# Patient Record
Sex: Female | Born: 1985
Health system: Southern US, Community
[De-identification: ages and names within clinical notes are randomized; demographics above are authoritative.]

## PROBLEM LIST (undated history)

## (undated) DIAGNOSIS — E119 Type 2 diabetes mellitus without complications: Secondary | ICD-10-CM

## (undated) DIAGNOSIS — R7611 Nonspecific reaction to tuberculin skin test without active tuberculosis: Secondary | ICD-10-CM

## (undated) DIAGNOSIS — O10919 Unspecified pre-existing hypertension complicating pregnancy, unspecified trimester: Secondary | ICD-10-CM

## (undated) DIAGNOSIS — N9489 Other specified conditions associated with female genital organs and menstrual cycle: Secondary | ICD-10-CM

## (undated) DIAGNOSIS — Z973 Presence of spectacles and contact lenses: Secondary | ICD-10-CM

## (undated) DIAGNOSIS — Z8759 Personal history of other complications of pregnancy, childbirth and the puerperium: Secondary | ICD-10-CM

## (undated) HISTORY — DX: Unspecified pre-existing hypertension complicating pregnancy, unspecified trimester: O10.919

## (undated) HISTORY — DX: Nonspecific reaction to tuberculin skin test without active tuberculosis: R76.11

## (undated) HISTORY — PX: BREAST SURGERY: SHX581

---

## 2004-06-27 HISTORY — PX: TONSILLECTOMY: SUR1361

## 2008-06-27 DIAGNOSIS — Z9289 Personal history of other medical treatment: Secondary | ICD-10-CM

## 2008-06-27 HISTORY — DX: Personal history of other medical treatment: Z92.89

## 2011-05-16 DIAGNOSIS — L709 Acne, unspecified: Secondary | ICD-10-CM | POA: Insufficient documentation

## 2011-05-16 DIAGNOSIS — R5383 Other fatigue: Secondary | ICD-10-CM | POA: Insufficient documentation

## 2011-06-28 HISTORY — PX: BREAST REDUCTION SURGERY: SHX8

## 2011-08-17 DIAGNOSIS — G8929 Other chronic pain: Secondary | ICD-10-CM | POA: Insufficient documentation

## 2011-08-22 DIAGNOSIS — E559 Vitamin D deficiency, unspecified: Secondary | ICD-10-CM | POA: Insufficient documentation

## 2011-08-25 DIAGNOSIS — R030 Elevated blood-pressure reading, without diagnosis of hypertension: Secondary | ICD-10-CM | POA: Insufficient documentation

## 2011-09-13 DIAGNOSIS — N62 Hypertrophy of breast: Secondary | ICD-10-CM | POA: Insufficient documentation

## 2011-12-14 DIAGNOSIS — N62 Hypertrophy of breast: Secondary | ICD-10-CM | POA: Insufficient documentation

## 2011-12-14 HISTORY — DX: Hypertrophy of breast: N62

## 2012-01-06 DIAGNOSIS — Z9889 Other specified postprocedural states: Secondary | ICD-10-CM | POA: Insufficient documentation

## 2012-01-06 HISTORY — DX: Other specified postprocedural states: Z98.890

## 2016-11-02 DIAGNOSIS — E1169 Type 2 diabetes mellitus with other specified complication: Secondary | ICD-10-CM | POA: Insufficient documentation

## 2016-11-02 DIAGNOSIS — I10 Essential (primary) hypertension: Secondary | ICD-10-CM

## 2016-11-02 DIAGNOSIS — I152 Hypertension secondary to endocrine disorders: Secondary | ICD-10-CM | POA: Insufficient documentation

## 2016-11-02 DIAGNOSIS — E669 Obesity, unspecified: Secondary | ICD-10-CM | POA: Insufficient documentation

## 2016-11-02 HISTORY — DX: Essential (primary) hypertension: I10

## 2018-07-28 HISTORY — PX: COLONOSCOPY WITH ESOPHAGOGASTRODUODENOSCOPY (EGD): SHX5779

## 2019-02-28 DIAGNOSIS — O10919 Unspecified pre-existing hypertension complicating pregnancy, unspecified trimester: Secondary | ICD-10-CM | POA: Insufficient documentation

## 2019-02-28 DIAGNOSIS — N979 Female infertility, unspecified: Secondary | ICD-10-CM | POA: Insufficient documentation

## 2019-02-28 DIAGNOSIS — Z6837 Body mass index (BMI) 37.0-37.9, adult: Secondary | ICD-10-CM | POA: Insufficient documentation

## 2019-02-28 DIAGNOSIS — Z3400 Encounter for supervision of normal first pregnancy, unspecified trimester: Secondary | ICD-10-CM | POA: Insufficient documentation

## 2019-02-28 HISTORY — DX: Female infertility, unspecified: N97.9

## 2019-03-01 DIAGNOSIS — O24313 Unspecified pre-existing diabetes mellitus in pregnancy, third trimester: Secondary | ICD-10-CM | POA: Insufficient documentation

## 2019-03-01 DIAGNOSIS — R7989 Other specified abnormal findings of blood chemistry: Secondary | ICD-10-CM

## 2019-03-01 HISTORY — DX: Other specified abnormal findings of blood chemistry: R79.89

## 2019-09-08 DIAGNOSIS — B951 Streptococcus, group B, as the cause of diseases classified elsewhere: Secondary | ICD-10-CM | POA: Insufficient documentation

## 2019-09-08 HISTORY — DX: Streptococcus, group b, as the cause of diseases classified elsewhere: B95.1

## 2020-06-12 ENCOUNTER — Other Ambulatory Visit (HOSPITAL_COMMUNITY): Payer: Self-pay | Admitting: Nurse Practitioner

## 2020-06-12 DIAGNOSIS — E669 Obesity, unspecified: Secondary | ICD-10-CM | POA: Diagnosis not present

## 2020-06-12 DIAGNOSIS — E119 Type 2 diabetes mellitus without complications: Secondary | ICD-10-CM | POA: Diagnosis not present

## 2020-06-12 DIAGNOSIS — Z6835 Body mass index (BMI) 35.0-35.9, adult: Secondary | ICD-10-CM | POA: Diagnosis not present

## 2020-06-15 DIAGNOSIS — Z6836 Body mass index (BMI) 36.0-36.9, adult: Secondary | ICD-10-CM | POA: Diagnosis not present

## 2020-06-15 DIAGNOSIS — Z01419 Encounter for gynecological examination (general) (routine) without abnormal findings: Secondary | ICD-10-CM | POA: Diagnosis not present

## 2020-06-16 MED FILL — metFORMIN HCL ER 500 MG TB2: 500 | 30 days supply | Qty: 30 | Fill #0

## 2020-07-03 ENCOUNTER — Ambulatory Visit: Payer: Self-pay | Admitting: Family Medicine

## 2020-07-11 MED FILL — metFORMIN HCL ER 500 MG TB2: 500 | 30 days supply | Qty: 30 | Fill #1

## 2020-07-14 ENCOUNTER — Ambulatory Visit: Payer: Self-pay | Admitting: Family Medicine

## 2020-07-20 ENCOUNTER — Other Ambulatory Visit: Payer: Self-pay | Admitting: Family Medicine

## 2020-07-22 ENCOUNTER — Other Ambulatory Visit: Payer: Self-pay

## 2020-07-22 ENCOUNTER — Other Ambulatory Visit: Payer: Self-pay | Admitting: Family Medicine

## 2020-07-22 ENCOUNTER — Ambulatory Visit (INDEPENDENT_AMBULATORY_CARE_PROVIDER_SITE_OTHER): Payer: 59 | Admitting: Family Medicine

## 2020-07-22 ENCOUNTER — Encounter: Payer: Self-pay | Admitting: Family Medicine

## 2020-07-22 VITALS — BP 127/88 | HR 88 | Temp 97.9°F | Ht 68.5 in | Wt 225.0 lb

## 2020-07-22 DIAGNOSIS — Z0000 Encounter for general adult medical examination without abnormal findings: Secondary | ICD-10-CM

## 2020-07-22 DIAGNOSIS — Z23 Encounter for immunization: Secondary | ICD-10-CM | POA: Diagnosis not present

## 2020-07-22 DIAGNOSIS — E669 Obesity, unspecified: Secondary | ICD-10-CM

## 2020-07-22 DIAGNOSIS — E1169 Type 2 diabetes mellitus with other specified complication: Secondary | ICD-10-CM

## 2020-07-22 LAB — POCT GLYCOSYLATED HEMOGLOBIN (HGB A1C)
HbA1c POC (<> result, manual entry): 6.7 % (ref 4.0–5.6)
HbA1c, POC (controlled diabetic range): 6.7 % (ref 0.0–7.0)
HbA1c, POC (prediabetic range): 6.7 % — AB (ref 5.7–6.4)
Hemoglobin A1C: 6.7 % — AB (ref 4.0–5.6)

## 2020-07-22 MED ORDER — METFORMIN HCL ER 500 MG PO TB24: 500.0000 mg | ORAL_TABLET | Freq: Two times a day (BID) | ORAL | 1 refills | Status: DC

## 2020-07-22 NOTE — Patient Instructions (Addendum)
Diabetes Mellitus and Nutrition, Adult When you have diabetes, or diabetes mellitus, it is very important to have healthy eating habits because your blood sugar (glucose) levels are greatly affected by what you eat and drink. Eating healthy foods in the right amounts, at about the same times every day, can help you:  Control your blood glucose.  Lower your risk of heart disease.  Improve your blood pressure.  Reach or maintain a healthy weight. What can affect my meal plan? Every person with diabetes is different, and each person has different needs for a meal plan. Your health care provider may recommend that you work with a dietitian to make a meal plan that is best for you. Your meal plan may vary depending on factors such as:  The calories you need.  The medicines you take.  Your weight.  Your blood glucose, blood pressure, and cholesterol levels.  Your activity level.  Other health conditions you have, such as heart or kidney disease. How do carbohydrates affect me? Carbohydrates, also called carbs, affect your blood glucose level more than any other type of food. Eating carbs naturally raises the amount of glucose in your blood. Carb counting is a method for keeping track of how many carbs you eat. Counting carbs is important to keep your blood glucose at a healthy level, especially if you use insulin or take certain oral diabetes medicines. It is important to know how many carbs you can safely have in each meal. This is different for every person. Your dietitian can help you calculate how many carbs you should have at each meal and for each snack. How does alcohol affect me? Alcohol can cause a sudden decrease in blood glucose (hypoglycemia), especially if you use insulin or take certain oral diabetes medicines. Hypoglycemia can be a life-threatening condition. Symptoms of hypoglycemia, such as sleepiness, dizziness, and confusion, are similar to symptoms of having too much  alcohol.  Do not drink alcohol if: ? Your health care provider tells you not to drink. ? You are pregnant, may be pregnant, or are planning to become pregnant.  If you drink alcohol: ? Do not drink on an empty stomach. ? Limit how much you use to:  0-1 drink a day for women.  0-2 drinks a day for men. ? Be aware of how much alcohol is in your drink. In the [place] one drink equals one 12 oz bottle of beer (355 mL), one 5 oz glass of wine (148 mL), or one 1 oz glass of hard liquor (44 mL). ? Keep yourself hydrated with water, diet soda, or unsweetened iced tea.  Keep in mind that regular soda, juice, and other mixers may contain a lot of sugar and must be counted as carbs. What are tips for following this plan? Reading food labels  Start by checking the serving size on the "Nutrition Facts" label of packaged foods and drinks. The amount of calories, carbs, fats, and other nutrients listed on the label is based on one serving of the item. Many items contain more than one serving per package.  Check the total grams (g) of carbs in one serving. You can calculate the number of servings of carbs in one serving by dividing the total carbs by 15. For example, if a food has 30 g of total carbs per serving, it would be equal to 2 servings of carbs.  Check the number of grams (g) of saturated fats and trans fats in one serving. Choose foods that have   a low amount or none of these fats.  Check the number of milligrams (mg) of salt (sodium) in one serving. Most people should limit total sodium intake to less than 2,300 mg per day.  Always check the nutrition information of foods labeled as "low-fat" or "nonfat." These foods may be higher in added sugar or refined carbs and should be avoided.  Talk to your dietitian to identify your daily goals for nutrients listed on the label. Shopping  Avoid buying canned, pre-made, or processed foods. These foods tend to be high in fat, sodium, and added  sugar.  Shop around the outside edge of the grocery store. This is where you will most often find fresh fruits and vegetables, bulk grains, fresh meats, and fresh dairy. Cooking  Use low-heat cooking methods, such as baking, instead of high-heat cooking methods like deep frying.  Cook using healthy oils, such as olive, canola, or sunflower oil.  Avoid cooking with butter, cream, or high-fat meats. Meal planning  Eat meals and snacks regularly, preferably at the same times every day. Avoid going long periods of time without eating.  Eat foods that are high in fiber, such as fresh fruits, vegetables, beans, and whole grains. Talk with your dietitian about how many servings of carbs you can eat at each meal.  Eat 4-6 oz (112-168 g) of lean protein each day, such as lean meat, chicken, fish, eggs, or tofu. One ounce (oz) of lean protein is equal to: ? 1 oz (28 g) of meat, chicken, or fish. ? 1 egg. ?  cup (62 g) of tofu.  Eat some foods each day that contain healthy fats, such as avocado, nuts, seeds, and fish.   What foods should I eat? Fruits Berries. Apples. Oranges. Peaches. Apricots. Plums. Grapes. Mango. Papaya. Pomegranate. Kiwi. Cherries. Vegetables Lettuce. Spinach. Leafy greens, including kale, chard, collard greens, and mustard greens. Beets. Cauliflower. Cabbage. Broccoli. Carrots. Green beans. Tomatoes. Peppers. Onions. Cucumbers. Brussels sprouts. Grains Whole grains, such as whole-wheat or whole-grain bread, crackers, tortillas, cereal, and pasta. Unsweetened oatmeal. Quinoa. Brown or wild rice. Meats and other proteins Seafood. Poultry without skin. Lean cuts of poultry and beef. Tofu. Nuts. Seeds. Dairy Low-fat or fat-free dairy products such as milk, yogurt, and cheese. The items listed above may not be a complete list of foods and beverages you can eat. Contact a dietitian for more information. What foods should I avoid? Fruits Fruits canned with  syrup. Vegetables Canned vegetables. Frozen vegetables with butter or cream sauce. Grains Refined white flour and flour products such as bread, pasta, snack foods, and cereals. Avoid all processed foods. Meats and other proteins Fatty cuts of meat. Poultry with skin. Breaded or fried meats. Processed meat. Avoid saturated fats. Dairy Full-fat yogurt, cheese, or milk. Beverages Sweetened drinks, such as soda or iced tea. The items listed above may not be a complete list of foods and beverages you should avoid. Contact a dietitian for more information. Questions to ask a health care provider  Do I need to meet with a diabetes educator?  Do I need to meet with a dietitian?  What number can I call if I have questions?  When are the best times to check my blood glucose? Where to find more information:  American Diabetes Association: diabetes.org  Academy of Nutrition and Dietetics: www.eatright.org  National Institute of Diabetes and Digestive and Kidney Diseases: www.niddk.nih.gov  Association of Diabetes Care and Education Specialists: www.diabeteseducator.org Summary  It is important to have healthy eating   habits because your blood sugar (glucose) levels are greatly affected by what you eat and drink.  A healthy meal plan will help you control your blood glucose and maintain a healthy lifestyle.  Your health care provider may recommend that you work with a dietitian to make a meal plan that is best for you.  Keep in mind that carbohydrates (carbs) and alcohol have immediate effects on your blood glucose levels. It is important to count carbs and to use alcohol carefully. This information is not intended to replace advice given to you by your health care provider. Make sure you discuss any questions you have with your health care provider. Document Revised: 05/21/2019 Document Reviewed: 05/21/2019 Elsevier Patient Education  2021 Elsevier Inc.    Please help [place] help you:   We are honored you have chosen [facility] for your Primary Care home. Below you will find basic instructions that you may need to access in the future. Please help [place] help you by reading the instructions, which cover many of the frequent questions we experience.   Prescription refills and request:  -In order to allow more efficient response time, please call your pharmacy for all refills. They will forward the request electronically to [place]. This allows for the quickest possible response. Request left on a nurse line can take longer to refill, since these are checked as time allows between office patients and other phone calls.  - refill request can take up to 3-5 working days to complete.  - If request is sent electronically and request is appropiate, it is usually completed in 1-2 business days.  - all patients will need to be seen routinely for all chronic medical conditions requiring prescription medications (see follow-up below). If you are overdue for follow up on your condition, you will be asked to make an appointment and we will call in enough medication to cover you until your appointment (up to 30 days).  - all controlled substances will require a face to face visit to request/refill.  - if you desire your prescriptions to go through a new pharmacy, and have an active script at original pharmacy, you will need to call your pharmacy and have scripts transferred to new pharmacy. This is completed between the pharmacy locations and not by your provider.    Results: Our office handles many outgoing and incoming calls daily. If we have not contacted you within 1 week about your results, please check your mychart to see if there is a message first and if not, then contact our office.  In helping with this matter, you help decrease call volume, and therefore allow [place] to be able to respond to patients needs more efficiently.  We will always attempt to call you with results,  normal or abnormal.  However, if we are unable to reach you we will send a message in your my chart with results.   Acute office visits (sick visit):  An acute visit is intended for a new problem and are scheduled in shorter time slots to allow schedule openings for patients with new problems. This is the appropriate visit to discuss a new problem. Problems will not be addressed by phone call or Echart message. Appointment is needed if requesting treatment. In order to provide you with excellent quality medical care with proper time for you to explain your problem, have an exam and receive treatment with instructions, these appointments should be limited to one new problem per visit. If you experience a new  problem, in which you desire to be addressed, please make an acute office visit, we save openings on the schedule to accommodate you. Please do not save your new problem for any other type of visit, let us take care of it properly and quickly for you.   Follow up visits:  Depending on your condition(s) your provider will need to see you routinely in order to provide you with quality care and prescribe medication(s). Most chronic conditions (Example: hypertension, Diabetes, depression/anxiety... etc), require visits a couple times a year. Your provider will instruct you on proper follow up for your personal medical conditions and history. Please make certain to make follow up appointments for your condition as instructed. Failing to do so could result in lapse in your medication treatment/refills. If you request a refill, and are overdue to be seen on a condition, we will always provide you with a 30 day script (once) to allow you time to schedule.     Yearly physical (well visit):  - Adults are recommended to be seen yearly for physicals. Check with your insurance and date of your last physical, most insurances require one calendar year between physicals. Physicals include all preventive health topics, screenings, medical  exam and labs that are appropriate for gender/age and history. You may have fasting labs needed at this visit. This is a well visit (not a sick visit), new problems should not be covered during this visit (see acute visit).  - Pediatric patients are seen more frequently when they are younger. Your provider will advise you on well child visit timing that is appropriate for your their age. - This is not a medicare wellness visit. Medicare wellness exams do not have an exam portion to the visit. Some medicare companies allow for a physical, some do not allow a yearly physical. If your medicare allows a yearly physical you can schedule the medicare wellness with our nurse Selena Batten and have your physical with your provider after, on the same day. Please check with insurance for your full benefits.   Late Policy/No Shows:  - all new patients should arrive 15-30 minutes earlier than appointment to allow [place] time  to  obtain all personal demographics,  insurance information and for you to complete office paperwork. - All established patients should arrive 10-15 minutes earlier than appointment time to update all information and be checked in .  - In our best efforts to run on time, if you are late for your appointment you will be asked to either reschedule or if able, we will work you back into the schedule. There will be a wait time to work you back in the schedule,  depending on availability.  - If you are unable to make it to your appointment as scheduled, please call 24 hours ahead of time to allow [place] to fill the time slot with someone else who needs to be seen. If you do not cancel your appointment ahead of time, you may be charged a no show fee.

## 2020-07-22 NOTE — Progress Notes (Signed)
Patient ID: Melinda Castillo, female  DOB: 1986-04-20, 35 y.o.   MRN: [id number] Patient Care Team    Relationship Specialty Notifications Start End  Natalia Leatherwood, DO PCP - General Family Medicine  07/22/20   Raynald Blend, OD  Optometry  07/22/20   Mitchel Honour, DO Consulting Physician Obstetrics and Gynecology  07/22/20   Alric Quan, [facility]    07/22/20 07/22/20  Marnette Burgess, MD  Gastroenterology  07/22/20     Chief Complaint  Patient presents with  . Establish Care    [facility];     Subjective: Melinda Castillo is a 35 y.o.  female present for new patient establishment. All past medical history, surgical history, allergies, family history, immunizations, medications and social history were updated in the electronic medical record today. All recent labs, ED visits and hospitalizations within the last year were reviewed.  Diabetes type 2: Pt reports compliance with metformin 500 ER. Insulin with pregnancy. Denies numbness, tingling of extremities, hypo/hyperglycemic events or non-healing wounds. Pt reports BG ranges are not routinely checked.  PNA series: PNA23 completed today Flu shot: UTD 2021/10(recommneded yearly) BMP: UTD 03/2020 Foot exam: 07/22/2020 Eye exam: 05/2019, Dr. Neale Burly A1c: 7.5 in October 2021> 6.7   Depression screen [facility] 2/9 07/22/2020  Decreased Interest 0  Down, Depressed, Hopeless 0  PHQ - 2 Score 0   No flowsheet data found.      Fall Risk  07/22/2020  Falls in the past year? 0  Number falls in past yr: 0  Injury with Fall? 0  Follow up Falls evaluation completed     Immunization History  Administered Date(s) Administered  . Influenza Split 03/28/2013  . Influenza, Quadrivalent, Recombinant, Inj, Pf 03/28/2019  . Influenza, Seasonal, Injecte, Preservative Fre 03/27/2018, 03/28/2019  . Influenza-Unspecified 03/27/2020  . PFIZER(Purple Top)SARS-COV-2 Vaccination 06/29/2019, 07/20/2019, 03/26/2020  . Rho  (D) Immune Globulin 07/15/2019, 09/26/2019  . Tdap 08/02/2019    No exam data present  Past Medical History:  Diagnosis Date  . Elevated liver function tests 03/01/2019   Formatting of this note might be different from the original. 02/28/2019: AST/ALT 59/60; repeat next visit. AST normal, ALT = 43 03/26/19  . Essential hypertension 11/02/2016  . Group beta Strep positive 09/08/2019  . History of bilateral breast reduction surgery 01/06/2012  . Infertility, 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 IVF pregnancy through Dr Elesa Hacker Day 5 embryo transfer. PGS-A testing completed  . Macromastia 12/14/2011   No Known Allergies Past Surgical History:  Procedure Laterality Date  . BREAST REDUCTION SURGERY  2013  . CESAREAN SECTION  2021  . COLONOSCOPY WITH ESOPHAGOGASTRODUODENOSCOPY (EGD)  07/2018   r/o celiac- completed for IBS sx. Normal.   . TONSILLECTOMY  2006   Family History  Problem Relation Age of Onset  . Hypertension Mother   . Hypertension Father   . Ovarian cancer Maternal Grandmother   . Early death Maternal Grandmother   . Heart disease Maternal Grandfather   . Brain cancer Paternal Grandfather   . Colon cancer Neg Hx   . Colon polyps Neg Hx    Social History   Social History Narrative  . Not on file    Allergies as of 07/22/2020   No Known Allergies     Medication List       Accurate as of July 22, 2020  4:07 PM. If you have any questions, ask your nurse or doctor.  STOP taking these medications   Multi-Vitamin tablet Stopped by: Felix Pacini, DO   Norlyda 0.35 MG tablet Generic drug: norethindrone Stopped by: Felix Pacini, DO   QSYMIA PO Stopped by: Felix Pacini, DO     TAKE these medications   metFORMIN 500 MG 24 hr tablet Commonly known as: GLUCOPHAGE-XR Take 1 tablet (500 mg total) by mouth in the morning and at bedtime. What changed: See the new instructions. Changed by: Felix Pacini, DO    prenatal multivitamin Tabs tablet Take 1 tablet by mouth daily at 12 noon.   ZyrTEC Allergy 10 MG Caps Generic drug: Cetirizine HCl Zyrtec       All past medical history, surgical history, allergies, family history, immunizations andmedications were updated in the EMR today and reviewed under the history and medication portions of their EMR.    Recent Results (from the past 2160 hour(s))  POCT HgB A1C     Status: Abnormal   Collection Time: 07/22/20  3:22 PM  Result Value Ref Range   Hemoglobin A1C 6.7 (A) 4.0 - 5.6 %   HbA1c POC (<> result, manual entry) 6.7 4.0 - 5.6 %   HbA1c, POC (prediabetic range) 6.7 (A) 5.7 - 6.4 %   HbA1c, POC (controlled diabetic range) 6.7 0.0 - 7.0 %    Patient was never admitted.   ROS: 14 pt review of systems performed and negative (unless mentioned in an HPI)  Objective: BP 127/88   Pulse 88   Temp 97.9 F (36.6 C) (Oral)   Ht 5' 8.5" (1.74 m)   Wt 225 lb (102.1 kg)   SpO2 99%   BMI 33.71 kg/m  Gen: Afebrile. No acute distress. Nontoxic in appearance, well-developed, well-nourished, pleasant female. HENT: AT. [place].  Eyes:Pupils Equal Round Reactive to light, Extraocular movements intact,  Conjunctiva without redness, discharge or icterus. Neck/lymp/endocrine: Supple,no lymphadenopathy, no thyromegaly CV: RRR no murmur, no edema, +2/4 P posterior tibialis pulses.  Chest: CTAB, no wheeze, rhonchi or crackles. normal Respiratory effort. good Air movement. Skin: Warm and well-perfused. Skin intact. Neuro/Msk:  Normal gait. PERLA. EOMi. Alert. Oriented x3.   Psych: Normal affect, dress and demeanor. Normal speech. Normal thought content and judgment. Diabetic Foot Exam - Simple   Simple Foot Form Diabetic Foot exam was performed with the following findings: Yes 07/22/2020  3:51 PM  Visual Inspection No deformities, no ulcerations, no other skin breakdown bilaterally: Yes Sensation Testing Intact to touch and monofilament testing  bilaterally: Yes Pulse Check Posterior Tibialis and Dorsalis pulse intact bilaterally: Yes Comments     Assessment/plan: Melinda Castillo is a 35 y.o. female present for est care/cmc Encounter for medical examination to establish care Diabetes mellitus type 2 in obese (HCC) Stable.  - increase metformin to 500 mg BID> pt is planning on 2nd pregnancy soon > if becomes pregnant OB will take over DM management during pregnancy.  - diabetic diet - continue routine exercise.  - POCT HgB A1C - Urine Microalbumin w/creat. ratio - Pneumococcal polysaccharide vaccine 23-valent greater than or equal to 2yo subcutaneous/IM PNA series: PNA23 completed today Flu shot: UTD 2021/10(recommneded yearly) BMP: UTD 03/2020 Microalb: collected today Foot exam: 07/22/2020 Eye exam: 05/2019, Dr. Neale Burly A1c: 7.5 in October 2021> 6.7 F/u 4 mos> once at goal ~6.0 will follow every 6 mos.   Orders Placed This Encounter  Procedures  . Pneumococcal polysaccharide vaccine 23-valent greater than or equal to 2yo subcutaneous/IM  . Urine Microalbumin w/creat. ratio  . POCT HgB A1C  Meds ordered this encounter  Medications  . metFORMIN (GLUCOPHAGE-XR) 500 MG 24 hr tablet    Sig: Take 1 tablet (500 mg total) by mouth in the morning and at bedtime.    Dispense:  180 tablet    Refill:  1   Referral Orders  No referral(s) requested today     Note is dictated utilizing voice recognition software. Although note has been proof read prior to signing, occasional typographical errors still can be missed. If any questions arise, please do not hesitate to call for verification.  Electronically signed by: Felix Pacini, DO [facility] [place]

## 2020-07-23 ENCOUNTER — Telehealth: Payer: Self-pay | Admitting: Family Medicine

## 2020-07-23 LAB — MICROALBUMIN / CREATININE URINE RATIO
Creatinine, Urine: 39 mg/dL (ref 20–275)
Microalb Creat Ratio: 5 ug/mg{creat}
Microalb, Ur: 0.2 mg/dL

## 2020-07-23 NOTE — Telephone Encounter (Signed)
Patient returned call for lab results. I relayed Dr. Alan Ripper following message "Urinalysis is normal." Patient voiced understanding.

## 2020-07-23 NOTE — Telephone Encounter (Signed)
noted 

## 2020-07-29 MED FILL — metFORMIN HCL ER 500 MG TB2: 500 | 90 days supply | Qty: 180 | Fill #0

## 2020-09-01 DIAGNOSIS — Z3169 Encounter for other general counseling and advice on procreation: Secondary | ICD-10-CM | POA: Diagnosis not present

## 2020-09-17 DIAGNOSIS — Z3141 Encounter for fertility testing: Secondary | ICD-10-CM | POA: Diagnosis not present

## 2020-09-18 ENCOUNTER — Other Ambulatory Visit (HOSPITAL_BASED_OUTPATIENT_CLINIC_OR_DEPARTMENT_OTHER): Payer: Self-pay

## 2020-09-21 ENCOUNTER — Other Ambulatory Visit (HOSPITAL_COMMUNITY): Payer: Self-pay | Admitting: Gynecology

## 2020-09-21 MED FILL — PROGESTERONE OIL 50 MG/ML V: 50 | 20 days supply | Qty: 20 | Fill #0

## 2020-09-21 MED FILL — METHYLPREDNISOLONE 8 MG TAB: 8 | 4 days supply | Qty: 8 | Fill #0

## 2020-09-21 MED FILL — BD 3 ML SYRINGE WITH NEEDLE: 23G X 1-1/2 | 30 days supply | Qty: 30 | Fill #0

## 2020-09-21 MED FILL — EASY TOUCH HYPODERMIC 18GX1: 18G X 1-1/2 | 30 days supply | Qty: 30 | Fill #0

## 2020-09-21 MED FILL — ESTRADIOL 2 MG TABS: 2 | 30 days supply | Qty: 60 | Fill #0

## 2020-10-19 MED FILL — Metformin HCl Tab ER 24HR 500 MG: ORAL | 90 days supply | Qty: 180 | Fill #0 | Status: AC

## 2020-10-20 ENCOUNTER — Other Ambulatory Visit (HOSPITAL_COMMUNITY): Payer: Self-pay

## 2020-10-22 ENCOUNTER — Other Ambulatory Visit (HOSPITAL_COMMUNITY): Payer: Self-pay

## 2020-10-22 MED FILL — Estradiol Tab 2 MG: ORAL | 30 days supply | Qty: 60 | Fill #0 | Status: AC

## 2020-10-26 DIAGNOSIS — Z3141 Encounter for fertility testing: Secondary | ICD-10-CM | POA: Diagnosis not present

## 2020-11-01 MED FILL — Syringe/Needle (Disp) 3 ML 23 x 1-1/2": 30 days supply | Qty: 30 | Fill #0 | Status: AC

## 2020-11-01 MED FILL — Progesterone IM in Oil 50 MG/ML: INTRAMUSCULAR | 20 days supply | Qty: 20 | Fill #0 | Status: AC

## 2020-11-01 MED FILL — Needle (Disp) 18 x 1-1/2": 30 days supply | Qty: 30 | Fill #0 | Status: AC

## 2020-11-02 ENCOUNTER — Other Ambulatory Visit (HOSPITAL_COMMUNITY): Payer: Self-pay

## 2020-11-02 DIAGNOSIS — N979 Female infertility, unspecified: Secondary | ICD-10-CM | POA: Diagnosis not present

## 2020-11-03 ENCOUNTER — Other Ambulatory Visit (HOSPITAL_COMMUNITY): Payer: Self-pay

## 2020-11-10 DIAGNOSIS — Z3200 Encounter for pregnancy test, result unknown: Secondary | ICD-10-CM | POA: Diagnosis not present

## 2020-11-20 ENCOUNTER — Ambulatory Visit: Payer: 59 | Admitting: Family Medicine

## 2020-11-25 ENCOUNTER — Ambulatory Visit: Payer: 59 | Admitting: Family Medicine

## 2020-11-25 ENCOUNTER — Other Ambulatory Visit (HOSPITAL_COMMUNITY): Payer: Self-pay

## 2020-11-25 MED ORDER — CLOMIPHENE CITRATE 50 MG PO TABS
ORAL_TABLET | ORAL | 6 refills | Status: DC
  Filled 2020-11-25: qty 5, 5d supply, fill #0

## 2020-11-27 ENCOUNTER — Ambulatory Visit: Payer: 59 | Admitting: Family Medicine

## 2020-11-27 ENCOUNTER — Other Ambulatory Visit: Payer: Self-pay

## 2020-11-27 ENCOUNTER — Other Ambulatory Visit (HOSPITAL_COMMUNITY): Payer: Self-pay

## 2020-11-27 ENCOUNTER — Encounter: Payer: Self-pay | Admitting: Family Medicine

## 2020-11-27 VITALS — BP 120/88 | HR 87 | Temp 97.4°F | Ht 69.0 in | Wt 232.0 lb

## 2020-11-27 DIAGNOSIS — E1169 Type 2 diabetes mellitus with other specified complication: Secondary | ICD-10-CM

## 2020-11-27 DIAGNOSIS — E669 Obesity, unspecified: Secondary | ICD-10-CM | POA: Diagnosis not present

## 2020-11-27 LAB — POCT GLYCOSYLATED HEMOGLOBIN (HGB A1C): Hemoglobin A1C: 5.9 % — AB (ref 4.0–5.6)

## 2020-11-27 MED ORDER — METFORMIN HCL ER 500 MG PO TB24
ORAL_TABLET | ORAL | 1 refills | Status: DC
  Filled 2020-11-27: qty 180, fill #0
  Filled 2021-01-21: qty 180, 90d supply, fill #0
  Filled 2021-04-27: qty 180, 90d supply, fill #1

## 2020-11-27 NOTE — Progress Notes (Signed)
Patient ID: Melinda Castillo, female  DOB: [birth year], 35 y.o.   MRN: [id number] Patient Care Team    Relationship Specialty Notifications Start End  Natalia Leatherwood, DO PCP - General Family Medicine  07/22/20   Raynald Blend, OD  Optometry  07/22/20   Mitchel Honour, DO Consulting Physician Obstetrics and Gynecology  07/22/20   Marnette Burgess, MD  Gastroenterology  07/22/20     Chief Complaint  Patient presents with  . Follow-up    [facility]; pt is not fasting  . Diabetes    Subjective: Melinda Castillo is a 35 y.o.  female present for [facility]  Diabetes type 2: Pt reports compliance  with metformin 500 ER BID. She did have Insulin with pregnancy. Patient denies dizziness, hyperglycemic or hypoglycemic events. Patient denies numbness, tingling in the extremities or nonhealing wounds of feet.  PNA series: PNA23 completed Flu shot: UTD 2021/10(recommneded yearly) Foot exam: 07/22/2020 Eye exam: 05/2019, Dr. Neale Burly- has appt June 20th scheduled.  A1c: 7.5 in October 2021> 6.7 (1/22)   Depression screen [facility] 2/9 07/22/2020  Decreased Interest 0  Down, Depressed, Hopeless 0  PHQ - 2 Score 0   No flowsheet data found.     Fall Risk  07/22/2020  Falls in the past year? 0  Number falls in past yr: 0  Injury with Fall? 0  Follow up Falls evaluation completed   Immunization History  Administered Date(s) Administered  . Influenza Split 03/28/2013  . Influenza, Quadrivalent, Recombinant, Inj, Pf 03/28/2019  . Influenza, Seasonal, Injecte, Preservative Fre 03/27/2018, 03/28/2019  . Influenza-Unspecified 03/27/2020  . PFIZER(Purple Top)SARS-COV-2 Vaccination 06/29/2019, 07/20/2019, 03/26/2020  . Pneumococcal Polysaccharide-23 07/22/2020  . Rho (D) Immune Globulin 07/15/2019, 09/26/2019  . Tdap 08/02/2019    No exam data present  Past Medical History:  Diagnosis Date  . Elevated liver function tests 03/01/2019   Formatting of this note might be different from the original.  02/28/2019: AST/ALT 59/60; repeat next visit. AST normal, ALT = 43 03/26/19  . Essential hypertension 11/02/2016  . Group beta Strep positive 09/08/2019  . History of bilateral breast reduction surgery 01/06/2012  . Infertility, 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 IVF pregnancy through Dr Elesa Hacker Day 5 embryo transfer. PGS-A testing completed  . Macromastia 12/14/2011  . 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 BREAST REDUCTION SURGERY  2013  . CESAREAN SECTION  2021  . COLONOSCOPY WITH ESOPHAGOGASTRODUODENOSCOPY (EGD)  07/2018   r/o celiac- completed for IBS sx. Normal.   . TONSILLECTOMY  2006   Family History  Problem Relation Age of Onset  . Hypertension Mother   . Hypertension Father   . Ovarian cancer Maternal Grandmother   . Early death Maternal Grandmother   . Heart disease Maternal Grandfather   . Brain cancer Paternal Grandfather   . Stroke Paternal Grandmother   . Colon cancer Neg Hx   . Colon polyps Neg Hx    Social History   Social History Narrative   Marital status/children/pets: married, 1 child.    Education/employment: Animator, works as Scientist, clinical (histocompatibility and immunogenetics) at [employer].    Safety:      -Wears a bicycle helmet riding a bike: Yes     -smoke alarm in the home:Yes     - wears seatbelt: Yes     - Feels safe in their relationships: Yes  Allergies as of 11/27/2020   No Known Allergies     Medication List       Accurate as of November 27, 2020  3:47 PM. If you have any questions, ask your nurse or doctor.        STOP taking these medications   B-D 3CC LUER-LOK SYR 23GX1-1/2 23G X 1-1/2" 3 ML Misc Generic drug: SYRINGE-NEEDLE (DISP) 3 ML Stopped by: Felix Pacini, DO   clomiPHENE 50 MG tablet Commonly known as: CLOMID Stopped by: Felix Pacini, DO   Easy Touch Hypodermic Needle 18G X 1-1/2" Misc Generic drug: NEEDLE (DISP) 18 G Stopped by: Felix Pacini, DO    estradiol 0.1 MG/24HR patch Commonly known as: VIVELLE-DOT Stopped by: Felix Pacini, DO   estradiol 2 MG tablet Commonly known as: ESTRACE Stopped by: Felix Pacini, DO   methylPREDNISolone 8 MG tablet Commonly known as: MEDROL Stopped by: Felix Pacini, DO   Norlyda 0.35 MG tablet Generic drug: norethindrone Stopped by: Felix Pacini, DO   progesterone 50 MG/ML injection Stopped by: Felix Pacini, DO     TAKE these medications   BD Disp Needles 22G X 1-1/2" Misc Generic drug: NEEDLE (DISP) 22 G   metFORMIN 500 MG 24 hr tablet Commonly known as: GLUCOPHAGE-XR TAKE 1 TABLET BY MOUTH EVERY MORNING AND AT BEDTIME What changed: Another medication with the same name was removed. Continue taking this medication, and follow the directions you see here. Changed by: Felix Pacini, DO   prenatal multivitamin Tabs tablet Take 1 tablet by mouth daily at 12 noon.   ZyrTEC Allergy 10 MG Caps Generic drug: Cetirizine HCl Zyrtec       All past medical history, surgical history, allergies, family history, immunizations andmedications were updated in the EMR today and reviewed under the history and medication portions of their EMR.    Recent Results (from the past 2160 hour(s))  POCT HgB A1C     Status: Abnormal   Collection Time: 11/27/20  3:30 PM  Result Value Ref Range   Hemoglobin A1C 5.9 (A) 4.0 - 5.6 %   HbA1c POC (<> result, manual entry)     HbA1c, POC (prediabetic range)     HbA1c, POC (controlled diabetic range)      Patient was never admitted.   ROS: 14 pt review of systems performed and negative (unless mentioned in an HPI)  Objective: BP 120/88   Pulse 87   Temp (!) 97.4 F (36.3 C) (Oral)   Ht 5\' 9"  (1.753 m)   Wt 232 lb (105.2 kg)   SpO2 98%   BMI 34.26 kg/m  Gen: Afebrile. No acute distress.  HENT: AT. [place]. Eyes:Pupils Equal Round Reactive to light, Extraocular movements intact,  Conjunctiva without redness, discharge or icterus. CV: RRR no murmur, no  edema, +2/4 P posterior tibialis pulses Chest: CTAB, no wheeze or crackles Neuro: Normal gait. PERLA. EOMi. Alert. Oriented x3  Assessment/plan: Melinda Castillo is a 35 y.o. female present for cmc Diabetes mellitus type 2 in obese (HCC) Stable. - Continue  metformin to 500 mg BID> pt is planning on 2nd pregnancy soon > if becomes pregnant OB will take over DM management during pregnancy.  - diabetic diet - continue  routine exercise.  PNA series: PNA23 UTD Flu shot: UTD 2021/10(recommneded yearly) Microalb: UTD Foot exam: 07/22/2020 Eye exam: 05/2019, Dr. 06/2019 A1c: 7.5 in October 2021> 6.7>5.9 F/u 5.5 mos. With fasting labs.    Orders Placed This Encounter  Procedures  . POCT HgB A1C  Meds ordered this encounter  Medications  . metFORMIN (GLUCOPHAGE-XR) 500 MG 24 hr tablet    Sig: TAKE 1 TABLET BY MOUTH EVERY MORNING AND AT BEDTIME    Dispense:  180 tablet    Refill:  1   Referral Orders  No referral(s) requested today     Note is dictated utilizing voice recognition software. Although note has been proof read prior to signing, occasional typographical errors still can be missed. If any questions arise, please do not hesitate to call for verification.  Electronically signed by: Felix Pacini, DO [facility]

## 2020-11-27 NOTE — Patient Instructions (Signed)
Great to see you today.   A1c 5.9 today!!!   Diabetes Mellitus and Foot Care Foot care is an important part of your health, especially when you have diabetes. Diabetes may cause you to have problems because of poor blood flow (circulation) to your feet and legs, which can cause your skin to:  Become thinner and drier.  Break more easily.  Heal more slowly.  Peel and crack. You may also have nerve damage (neuropathy) in your legs and feet, causing decreased feeling in them. This means that you may not notice minor injuries to your feet that could lead to more serious problems. Noticing and addressing any potential problems early is the best way to prevent future foot problems. How to care for your feet Foot hygiene  Wash your feet daily with warm water and mild soap. Do not use hot water. Then, pat your feet and the areas between your toes until they are completely dry. Do not soak your feet as this can dry your skin.  Trim your toenails straight across. Do not dig under them or around the cuticle. File the edges of your nails with an emery board or nail file.  Apply a moisturizing lotion or petroleum jelly to the skin on your feet and to dry, brittle toenails. Use lotion that does not contain alcohol and is unscented. Do not apply lotion between your toes.   Shoes and socks  Wear clean socks or stockings every day. Make sure they are not too tight. Do not wear knee-high stockings since they may decrease blood flow to your legs.  Wear shoes that fit properly and have enough cushioning. Always look in your shoes before you put them on to be sure there are no objects inside.  To break in new shoes, wear them for just a few hours a day. This prevents injuries on your feet. Wounds, scrapes, corns, and calluses  Check your feet daily for blisters, cuts, bruises, sores, and redness. If you cannot see the bottom of your feet, use a mirror or ask someone for help.  Do not cut corns or  calluses or try to remove them with medicine.  If you find a minor scrape, cut, or break in the skin on your feet, keep it and the skin around it clean and dry. You may clean these areas with mild soap and water. Do not clean the area with peroxide, alcohol, or iodine.  If you have a wound, scrape, corn, or callus on your foot, look at it several times a day to make sure it is healing and not infected. Check for: ? Redness, swelling, or pain. ? Fluid or blood. ? Warmth. ? Pus or a bad smell.   General tips  Do not cross your legs. This may decrease blood flow to your feet.  Do not use heating pads or hot water bottles on your feet. They may burn your skin. If you have lost feeling in your feet or legs, you may not know this is happening until it is too late.  Protect your feet from hot and cold by wearing shoes, such as at the beach or on hot pavement.  Schedule a complete foot exam at least once a year (annually) or more often if you have foot problems. Report any cuts, sores, or bruises to your health care provider immediately. Where to find more information  American Diabetes Association: www.diabetes.org  Association of Diabetes Care & Education Specialists: www.diabeteseducator.org Contact a health care provider if:  You have a medical condition that increases your risk of infection and you have any cuts, sores, or bruises on your feet.  You have an injury that is not healing.  You have redness on your legs or feet.  You feel burning or tingling in your legs or feet.  You have pain or cramps in your legs and feet.  Your legs or feet are numb.  Your feet always feel cold.  You have pain around any toenails. Get help right away if:  You have a wound, scrape, corn, or callus on your foot and: ? You have pain, swelling, or redness that gets worse. ? You have fluid or blood coming from the wound, scrape, corn, or callus. ? Your wound, scrape, corn, or callus feels warm to  the touch. ? You have pus or a bad smell coming from the wound, scrape, corn, or callus. ? You have a fever. ? You have a red line going up your leg. Summary  Check your feet every day for blisters, cuts, bruises, sores, and redness.  Apply a moisturizing lotion or petroleum jelly to the skin on your feet and to dry, brittle toenails.  Wear shoes that fit properly and have enough cushioning.  If you have foot problems, report any cuts, sores, or bruises to your health care provider immediately.  Schedule a complete foot exam at least once a year (annually) or more often if you have foot problems. This information is not intended to replace advice given to you by your health care provider. Make sure you discuss any questions you have with your health care provider. Document Revised: 01/02/2020 Document Reviewed: 01/02/2020 Elsevier Patient Education  2021 [employer].

## 2020-12-21 DIAGNOSIS — O200 Threatened abortion: Secondary | ICD-10-CM | POA: Diagnosis not present

## 2020-12-21 DIAGNOSIS — O4691 Antepartum hemorrhage, unspecified, first trimester: Secondary | ICD-10-CM | POA: Diagnosis not present

## 2021-01-21 ENCOUNTER — Other Ambulatory Visit (HOSPITAL_COMMUNITY): Payer: Self-pay

## 2021-01-25 DIAGNOSIS — Z8759 Personal history of other complications of pregnancy, childbirth and the puerperium: Secondary | ICD-10-CM

## 2021-01-25 HISTORY — DX: Personal history of other complications of pregnancy, childbirth and the puerperium: Z87.59

## 2021-01-27 DIAGNOSIS — N912 Amenorrhea, unspecified: Secondary | ICD-10-CM | POA: Diagnosis not present

## 2021-01-29 DIAGNOSIS — N912 Amenorrhea, unspecified: Secondary | ICD-10-CM | POA: Diagnosis not present

## 2021-02-02 ENCOUNTER — Other Ambulatory Visit: Payer: Self-pay

## 2021-02-02 ENCOUNTER — Inpatient Hospital Stay (HOSPITAL_COMMUNITY)
Admission: AD | Admit: 2021-02-02 | Discharge: 2021-02-02 | Disposition: A | Discharge status: Home / Self Care | Payer: 59 | Attending: Obstetrics & Gynecology | Admitting: Obstetrics & Gynecology

## 2021-02-02 ENCOUNTER — Other Ambulatory Visit: Payer: Self-pay | Admitting: Obstetrics & Gynecology

## 2021-02-02 ENCOUNTER — Encounter (HOSPITAL_COMMUNITY): Payer: Self-pay | Admitting: Obstetrics & Gynecology

## 2021-02-02 DIAGNOSIS — N911 Secondary amenorrhea: Secondary | ICD-10-CM | POA: Diagnosis not present

## 2021-02-02 DIAGNOSIS — Z3201 Encounter for pregnancy test, result positive: Secondary | ICD-10-CM | POA: Insufficient documentation

## 2021-02-02 DIAGNOSIS — Z3A01 Less than 8 weeks gestation of pregnancy: Secondary | ICD-10-CM

## 2021-02-02 DIAGNOSIS — O34219 Maternal care for unspecified type scar from previous cesarean delivery: Secondary | ICD-10-CM

## 2021-02-02 DIAGNOSIS — O3680X0 Pregnancy with inconclusive fetal viability, not applicable or unspecified: Secondary | ICD-10-CM

## 2021-02-02 NOTE — MAU Provider Note (Signed)
History Melinda Castillo is a 35 y.o. G2P1001 at [redacted]w[redacted]d who presents from the office for ultrasound. Had ultrasound at [facility] today & was told she may have an ectopic at her c/section site. Dr. Langston Masker sent her over for u/s with MFM. Denies abdominal pain or vaginal bleeding.   Physical exam BP (!) 136/93 (BP Location: Right Arm)   Pulse 95   Temp 98.4 F (36.9 C) (Oral)   Resp 20   Ht 5\' 10"  (1.778 m)   Wt 104.9 kg   LMP 12/20/2020   SpO2 100%   BMI 33.19 kg/m   Physical Examination: General appearance - alert, well appearing, and in no distress Mental status - normal mood, behavior, speech, dress, motor activity, and thought processes Eyes - sclera anicteric Chest - normal respiratory effort   Assessment 1. Positive pregnancy test    -Per Dr. 12/22/2020 (who spoke with Dr. Judeth Cornfield) patient will need to f/u in MAU tomorrow as MFM needs to see imaging in real time. He has scheduled her in MFM for 8 am tomorrow morning with patient to arrive at 730 am.   Plan Discharge in stable condition Pt given address for MFM & instructed to arrive at 730 am tomorrow morning.

## 2021-02-02 NOTE — MAU Note (Signed)
Sent from MD office for MFM consult.  Reports has ectopic pregnancy in Cesarean section scar.  Denies VB, states it's intermittent.  LMP 12/20/2020

## 2021-02-02 NOTE — Discharge Instructions (Signed)
Return to care  If you have heavier bleeding that soaks through more than 2 pads per hour for an hour or more If you bleed so much that you feel like you might pass out or you do pass out If you have significant abdominal pain that is not improved with Tylenol   

## 2021-02-03 ENCOUNTER — Ambulatory Visit: Payer: 59 | Attending: Obstetrics and Gynecology | Admitting: Obstetrics and Gynecology

## 2021-02-03 ENCOUNTER — Encounter: Payer: Self-pay | Admitting: *Deleted

## 2021-02-03 ENCOUNTER — Ambulatory Visit (HOSPITAL_BASED_OUTPATIENT_CLINIC_OR_DEPARTMENT_OTHER): Payer: 59

## 2021-02-03 ENCOUNTER — Other Ambulatory Visit: Payer: Self-pay | Admitting: *Deleted

## 2021-02-03 ENCOUNTER — Ambulatory Visit: Payer: 59 | Admitting: *Deleted

## 2021-02-03 VITALS — BP 121/86 | HR 77

## 2021-02-03 DIAGNOSIS — Z3A01 Less than 8 weeks gestation of pregnancy: Secondary | ICD-10-CM

## 2021-02-03 DIAGNOSIS — O34219 Maternal care for unspecified type scar from previous cesarean delivery: Secondary | ICD-10-CM

## 2021-02-03 DIAGNOSIS — O3680X0 Pregnancy with inconclusive fetal viability, not applicable or unspecified: Secondary | ICD-10-CM

## 2021-02-03 DIAGNOSIS — Z3200 Encounter for pregnancy test, result unknown: Secondary | ICD-10-CM

## 2021-02-03 DIAGNOSIS — O009 Unspecified ectopic pregnancy without intrauterine pregnancy: Secondary | ICD-10-CM

## 2021-02-03 DIAGNOSIS — O008 Other ectopic pregnancy without intrauterine pregnancy: Secondary | ICD-10-CM

## 2021-02-03 DIAGNOSIS — Z3201 Encounter for pregnancy test, result positive: Secondary | ICD-10-CM | POA: Diagnosis not present

## 2021-02-03 NOTE — Progress Notes (Signed)
Maternal-Fetal Medicine   Name: Melinda Castillo DOB: 1985-09-06 MRN: [id number] Referring Provider: Mitchel Honour, MD  I had the pleasure of seeing [facility] at [facility]. She was accompanied by her husband. She is G2 P1 at 5w 6d gestation with suspected cesarean scar pregnancy.  Patient has been having intermittent vaginal bleeding.  At your office ultrasound, cesarean scar pregnancy was suspected.  Obstetric history significant for a term cesarean delivery (failure to progress in labor) of a female infant weighing 8 pounds 9 ounces at birth.  Pregnancy conceived after IVF.  Past medical history: Type 2 diabetes patient takes metformin.  Recent hemoglobin level was 5.9%.  She does not have hypertension or any other chronic medical conditions. Allergies: No known drug allergies. Prenatal course: This is a natural conception.  Ultrasound We performed a transabdominal and transvaginal ultrasound.  Findings include -A single intrauterine gestational sac is seen in the lower uterus (center of gestational sac below the midpoint of the uterus).  -Fetal pole with CRL measurement consistent with the previously established date is seen.  Fetal heart rate was 109/min.  -Gestational sac is close to the previous cesarean scar but not invading the niche. Sac appears triangular at the level of scar.  -Myometrium is thinned out and is less than 3 mm at the level of gestational sac.  -No bulging of the sac into the bladder.  Myometrial bladder interface appears normal.  -Color-flow shows increased vascularity at the implantation site.  -An echogenic material consistent with blood clot is seen close to the fundus (history of vaginal bleeding).  Impression: Cesarean scar pregnancy.  Our concerns include: Cesarean scar pregnancy -Findings strongly raise the possibility of cesarean scar pregnancy.  Ideal time to diagnose condition will be between 6- and 8-weeks'  gestation when placenta is being formed. -Differential diagnosis include intrauterine pregnancy (low possibility). -I discussed the natural course of cesarean scar pregnancy. Complications include severe hemorrhage, placenta accreta spectrum (PAS). Uterine rupture in later gestations have been reported. -Expectant management leads to hemorrhagic complications in more than 50% of cases and can lead to hysterectomy. -Most cases evolve into PAS. I discussed PAS and that it usually leads to hysterectomy at delivery. Management of cesarean scar pregnancy -Vacuum aspiration (suction curettage) under ultrasound guidance with laparoscopic/laparotomy preparations showed good results with lower complication rate -Intragestational injection of methotrexate (or less commonly, potassium chloride) followed by vacuum aspiration has also shown to be associated with fewer complications. -Systemic methotrexate alone has shown higher complication rates. However, if vacuum aspiration is the mainstay management after methotrexate, it should be associated with fewer complications. Although the diagnosis will be clearer at 7 to 8 weeks' gestation, she could experience increased vaginal bleeding in the waiting period. Spontaneous loss of scar pregnancy is a possibility (10%). Recurrence of scar pregnancy is 15% to 20%.  I spoke to Dr. April Manson [facility]) who had performed several procedures (systemic methotrexate and vacuum aspiration) with good success.  If patient decides on definitive treatment, I recommend referring to Dr. April Manson.  I discussed with Dr. Langston Masker. She called the patient who informed her that she would like to wait for 2 weeks. She is aware of the possibility of increased vaginal bleeding.  Thank you for consultation.  If you have any questions or concerns, please contact me the [facility].  Consultation including face-to-face (more than 50%) counseling 45  minutes.

## 2021-02-17 ENCOUNTER — Other Ambulatory Visit: Payer: Self-pay

## 2021-02-19 ENCOUNTER — Other Ambulatory Visit: Payer: Self-pay

## 2021-02-19 ENCOUNTER — Ambulatory Visit: Payer: 59 | Admitting: *Deleted

## 2021-02-19 ENCOUNTER — Ambulatory Visit: Payer: 59 | Attending: Obstetrics and Gynecology

## 2021-02-19 VITALS — BP 129/85 | HR 82

## 2021-02-19 DIAGNOSIS — O34219 Maternal care for unspecified type scar from previous cesarean delivery: Secondary | ICD-10-CM

## 2021-02-19 DIAGNOSIS — O209 Hemorrhage in early pregnancy, unspecified: Secondary | ICD-10-CM | POA: Diagnosis not present

## 2021-02-19 DIAGNOSIS — Z3A08 8 weeks gestation of pregnancy: Secondary | ICD-10-CM

## 2021-02-19 DIAGNOSIS — O09521 Supervision of elderly multigravida, first trimester: Secondary | ICD-10-CM

## 2021-02-19 DIAGNOSIS — O008 Other ectopic pregnancy without intrauterine pregnancy: Secondary | ICD-10-CM | POA: Diagnosis not present

## 2021-02-19 DIAGNOSIS — Z3200 Encounter for pregnancy test, result unknown: Secondary | ICD-10-CM

## 2021-02-20 ENCOUNTER — Other Ambulatory Visit: Payer: Self-pay

## 2021-02-20 ENCOUNTER — Inpatient Hospital Stay (HOSPITAL_COMMUNITY)
Admission: AD | Admit: 2021-02-20 | Discharge: 2021-02-20 | Disposition: A | Discharge status: Home / Self Care | Payer: 59 | Attending: Obstetrics & Gynecology | Admitting: Obstetrics & Gynecology

## 2021-02-20 DIAGNOSIS — O008 Other ectopic pregnancy without intrauterine pregnancy: Secondary | ICD-10-CM | POA: Diagnosis not present

## 2021-02-20 DIAGNOSIS — Z3A09 9 weeks gestation of pregnancy: Secondary | ICD-10-CM | POA: Diagnosis not present

## 2021-02-20 LAB — CBC WITH DIFFERENTIAL/PLATELET
Abs Immature Granulocytes: 0.02 10*3/uL (ref 0.00–0.07)
Basophils Absolute: 0 10*3/uL (ref 0.0–0.1)
Basophils Relative: 0 %
Eosinophils Absolute: 0.1 10*3/uL (ref 0.0–0.5)
Eosinophils Relative: 1 %
HCT: 36.9 % (ref 36.0–46.0)
Hemoglobin: 12.4 g/dL (ref 12.0–15.0)
Immature Granulocytes: 0 %
Lymphocytes Relative: 33 %
Lymphs Abs: 1.9 10*3/uL (ref 0.7–4.0)
MCH: 29.5 pg (ref 26.0–34.0)
MCHC: 33.6 g/dL (ref 30.0–36.0)
MCV: 87.6 fL (ref 80.0–100.0)
Monocytes Absolute: 0.5 10*3/uL (ref 0.1–1.0)
Monocytes Relative: 9 %
Neutro Abs: 3.3 10*3/uL (ref 1.7–7.7)
Neutrophils Relative %: 57 %
Platelets: 201 10*3/uL (ref 150–400)
RBC: 4.21 MIL/uL (ref 3.87–5.11)
RDW: 12.9 % (ref 11.5–15.5)
WBC: 5.8 10*3/uL (ref 4.0–10.5)
nRBC: 0 % (ref 0.0–0.2)

## 2021-02-20 LAB — COMPREHENSIVE METABOLIC PANEL
ALT: 26 U/L (ref 0–44)
AST: 17 U/L (ref 15–41)
Albumin: 3.7 g/dL (ref 3.5–5.0)
Alkaline Phosphatase: 39 U/L (ref 38–126)
Anion gap: 7 (ref 5–15)
BUN: 11 mg/dL (ref 6–20)
CO2: 23 mmol/L (ref 22–32)
Calcium: 9 mg/dL (ref 8.9–10.3)
Chloride: 105 mmol/L (ref 98–111)
Creatinine, Ser: 0.51 mg/dL (ref 0.44–1.00)
GFR, Estimated: >60 mL/min (ref >60)
Glucose, Bld: 105 mg/dL — ABNORMAL HIGH (ref 70–99)
Potassium: 4.3 mmol/L (ref 3.5–5.1)
Sodium: 135 mmol/L (ref 135–145)
Total Bilirubin: 0.8 mg/dL (ref 0.3–1.2)
Total Protein: 6.6 g/dL (ref 6.5–8.1)

## 2021-02-20 MED ORDER — METHOTREXATE FOR ECTOPIC PREGNANCY
50.0000 mg/m2 | Freq: Once | INTRAMUSCULAR | Status: AC
  Administered 2021-02-20: 115.5 mg via INTRAMUSCULAR
  Filled 2021-02-20: qty 4.62

## 2021-02-20 NOTE — MAU Note (Signed)
Dr April Manson is to call pt on Monday, plan is for surgery during the wk.  Pt has Dr Lilyan Punt cell #.  Will given post MTX care instructions to pt

## 2021-02-20 NOTE — MAU Note (Signed)
Pt for methotrexate.  Denies pain or bleeding. Little nausea.  "It is in the c/s scar, saw it on [place]".

## 2021-02-20 NOTE — Progress Notes (Signed)
Patient presents to MAU today for MTX injection.  She has been followed by MFM for suspected C/S scar ectopic.  To ensure dx, patient had last u/s yesterday with Dr. Judeth Cornfield; see note in chart.  Patient has been counseled re: risk associated with C/S scar ectopic and plan has been coordinated between myself, Dr. Judeth Cornfield, and Dr. April Manson in management.  Plan to give single dose of MTX today with plans for [facility] with Dr. April Manson next week.  Patient will have MTX labs drawn today prior to injection.  Dr. April Manson plans to contact patient Monday afternoon to schedule preop visit and u/s in his office and surgery later next week.  Patient is blood type O neg and this has been communicated to Dr. April Manson as well.  This plan is again communicated to the patient in person.  Mitchel Honour, DO

## 2021-02-22 ENCOUNTER — Other Ambulatory Visit (HOSPITAL_COMMUNITY): Payer: Self-pay

## 2021-02-22 DIAGNOSIS — O008 Other ectopic pregnancy without intrauterine pregnancy: Secondary | ICD-10-CM | POA: Diagnosis not present

## 2021-02-22 DIAGNOSIS — O009 Unspecified ectopic pregnancy without intrauterine pregnancy: Secondary | ICD-10-CM | POA: Diagnosis not present

## 2021-02-22 MED ORDER — METHOTREXATE SODIUM CHEMO INJECTION 50 MG/2ML
INTRAMUSCULAR | 0 refills | Status: DC
  Filled 2021-02-22: qty 4, 1d supply, fill #0

## 2021-02-23 DIAGNOSIS — G8918 Other acute postprocedural pain: Secondary | ICD-10-CM | POA: Diagnosis not present

## 2021-02-23 DIAGNOSIS — O008 Other ectopic pregnancy without intrauterine pregnancy: Secondary | ICD-10-CM | POA: Diagnosis not present

## 2021-02-25 DIAGNOSIS — O009 Unspecified ectopic pregnancy without intrauterine pregnancy: Secondary | ICD-10-CM | POA: Diagnosis not present

## 2021-02-26 DIAGNOSIS — Z7689 Persons encountering health services in other specified circumstances: Secondary | ICD-10-CM | POA: Diagnosis not present

## 2021-03-01 ENCOUNTER — Inpatient Hospital Stay (HOSPITAL_COMMUNITY): Payer: 59

## 2021-03-01 ENCOUNTER — Encounter (HOSPITAL_COMMUNITY): Payer: Self-pay | Admitting: Obstetrics and Gynecology

## 2021-03-01 ENCOUNTER — Observation Stay (HOSPITAL_COMMUNITY): Payer: 59

## 2021-03-01 ENCOUNTER — Encounter (HOSPITAL_COMMUNITY)
Admission: AD | Disposition: A | Discharge status: Home / Self Care | Payer: Self-pay | Source: Home / Self Care | Attending: Obstetrics and Gynecology

## 2021-03-01 ENCOUNTER — Other Ambulatory Visit: Payer: Self-pay

## 2021-03-01 ENCOUNTER — Inpatient Hospital Stay (HOSPITAL_COMMUNITY): Payer: 59 | Admitting: Certified Registered Nurse Anesthetist

## 2021-03-01 ENCOUNTER — Inpatient Hospital Stay (HOSPITAL_COMMUNITY)
Admission: AD | Admit: 2021-03-01 | Discharge: 2021-03-03 | DRG: 818 | Disposition: A | Discharge status: Home / Self Care | Payer: 59 | Attending: Obstetrics and Gynecology | Admitting: Obstetrics and Gynecology

## 2021-03-01 DIAGNOSIS — R7611 Nonspecific reaction to tuberculin skin test without active tuberculosis: Secondary | ICD-10-CM | POA: Diagnosis present

## 2021-03-01 DIAGNOSIS — Z808 Family history of malignant neoplasm of other organs or systems: Secondary | ICD-10-CM

## 2021-03-01 DIAGNOSIS — Z8249 Family history of ischemic heart disease and other diseases of the circulatory system: Secondary | ICD-10-CM | POA: Diagnosis not present

## 2021-03-01 DIAGNOSIS — O09819 Supervision of pregnancy resulting from assisted reproductive technology, unspecified trimester: Secondary | ICD-10-CM | POA: Diagnosis not present

## 2021-03-01 DIAGNOSIS — N99820 Postprocedural hemorrhage and hematoma of a genitourinary system organ or structure following a genitourinary system procedure: Secondary | ICD-10-CM | POA: Diagnosis not present

## 2021-03-01 DIAGNOSIS — O008 Other ectopic pregnancy without intrauterine pregnancy: Secondary | ICD-10-CM | POA: Diagnosis not present

## 2021-03-01 DIAGNOSIS — D500 Iron deficiency anemia secondary to blood loss (chronic): Secondary | ICD-10-CM | POA: Diagnosis not present

## 2021-03-01 DIAGNOSIS — O161 Unspecified maternal hypertension, first trimester: Secondary | ICD-10-CM | POA: Diagnosis not present

## 2021-03-01 DIAGNOSIS — O009 Unspecified ectopic pregnancy without intrauterine pregnancy: Secondary | ICD-10-CM | POA: Diagnosis not present

## 2021-03-01 DIAGNOSIS — O0901 Supervision of pregnancy with history of infertility, first trimester: Secondary | ICD-10-CM

## 2021-03-01 DIAGNOSIS — Z3A13 13 weeks gestation of pregnancy: Secondary | ICD-10-CM | POA: Diagnosis not present

## 2021-03-01 DIAGNOSIS — Z9889 Other specified postprocedural states: Secondary | ICD-10-CM

## 2021-03-01 DIAGNOSIS — O99011 Anemia complicating pregnancy, first trimester: Secondary | ICD-10-CM | POA: Diagnosis present

## 2021-03-01 DIAGNOSIS — R188 Other ascites: Secondary | ICD-10-CM | POA: Diagnosis not present

## 2021-03-01 DIAGNOSIS — O24911 Unspecified diabetes mellitus in pregnancy, first trimester: Secondary | ICD-10-CM | POA: Diagnosis present

## 2021-03-01 DIAGNOSIS — Z7984 Long term (current) use of oral hypoglycemic drugs: Secondary | ICD-10-CM | POA: Diagnosis not present

## 2021-03-01 DIAGNOSIS — Z8041 Family history of malignant neoplasm of ovary: Secondary | ICD-10-CM | POA: Diagnosis not present

## 2021-03-01 DIAGNOSIS — N9489 Other specified conditions associated with female genital organs and menstrual cycle: Secondary | ICD-10-CM | POA: Diagnosis present

## 2021-03-01 DIAGNOSIS — Y839 Surgical procedure, unspecified as the cause of abnormal reaction of the patient, or of later complication, without mention of misadventure at the time of the procedure: Secondary | ICD-10-CM | POA: Diagnosis not present

## 2021-03-01 DIAGNOSIS — Z79899 Other long term (current) drug therapy: Secondary | ICD-10-CM | POA: Diagnosis not present

## 2021-03-01 DIAGNOSIS — Z823 Family history of stroke: Secondary | ICD-10-CM

## 2021-03-01 DIAGNOSIS — Z20822 Contact with and (suspected) exposure to covid-19: Secondary | ICD-10-CM | POA: Diagnosis present

## 2021-03-01 DIAGNOSIS — N938 Other specified abnormal uterine and vaginal bleeding: Secondary | ICD-10-CM | POA: Diagnosis not present

## 2021-03-01 HISTORY — DX: Type 2 diabetes mellitus without complications: E11.9

## 2021-03-01 HISTORY — DX: Other specified postprocedural states: Z98.890

## 2021-03-01 HISTORY — PX: DILATION AND CURETTAGE OF UTERUS: SHX78

## 2021-03-01 LAB — CBC
HCT: 29.6 % — ABNORMAL LOW (ref 36.0–46.0)
HCT: 31.4 % — ABNORMAL LOW (ref 36.0–46.0)
Hemoglobin: 10 g/dL — ABNORMAL LOW (ref 12.0–15.0)
Hemoglobin: 10.5 g/dL — ABNORMAL LOW (ref 12.0–15.0)
MCH: 29.9 pg (ref 26.0–34.0)
MCH: 29.5 pg (ref 26.0–34.0)
MCHC: 33.8 g/dL (ref 30.0–36.0)
MCHC: 33.4 g/dL (ref 30.0–36.0)
MCV: 88.4 fL (ref 80.0–100.0)
MCV: 88.2 fL (ref 80.0–100.0)
Platelets: 133 10*3/uL — ABNORMAL LOW (ref 150–400)
Platelets: 153 10*3/uL (ref 150–400)
RBC: 3.35 MIL/uL — ABNORMAL LOW (ref 3.87–5.11)
RBC: 3.56 MIL/uL — ABNORMAL LOW (ref 3.87–5.11)
RDW: 12.9 % (ref 11.5–15.5)
RDW: 12.8 % (ref 11.5–15.5)
WBC: 3.7 10*3/uL — ABNORMAL LOW (ref 4.0–10.5)
WBC: 5.1 10*3/uL (ref 4.0–10.5)
nRBC: 0 % (ref 0.0–0.2)
nRBC: 0 % (ref 0.0–0.2)

## 2021-03-01 LAB — COMPREHENSIVE METABOLIC PANEL
ALT: 46 U/L — ABNORMAL HIGH (ref 0–44)
AST: 23 U/L (ref 15–41)
Albumin: 3.1 g/dL — ABNORMAL LOW (ref 3.5–5.0)
Alkaline Phosphatase: 36 U/L — ABNORMAL LOW (ref 38–126)
Anion gap: 6 (ref 5–15)
BUN: 7 mg/dL (ref 6–20)
CO2: 23 mmol/L (ref 22–32)
Calcium: 8.6 mg/dL — ABNORMAL LOW (ref 8.9–10.3)
Chloride: 107 mmol/L (ref 98–111)
Creatinine, Ser: 0.44 mg/dL (ref 0.44–1.00)
GFR, Estimated: >60 mL/min (ref >60)
Glucose, Bld: 100 mg/dL — ABNORMAL HIGH (ref 70–99)
Potassium: 3.3 mmol/L — ABNORMAL LOW (ref 3.5–5.1)
Sodium: 136 mmol/L (ref 135–145)
Total Bilirubin: 0.6 mg/dL (ref 0.3–1.2)
Total Protein: 5.5 g/dL — ABNORMAL LOW (ref 6.5–8.1)

## 2021-03-01 LAB — GLUCOSE, CAPILLARY
Glucose-Capillary: 82 mg/dL (ref 70–99)
Glucose-Capillary: 104 mg/dL — ABNORMAL HIGH (ref 70–99)

## 2021-03-01 LAB — SARS CORONAVIRUS 2 BY RT PCR (HOSPITAL ORDER, PERFORMED IN ~~LOC~~ HOSPITAL LAB): SARS Coronavirus 2: NEGATIVE

## 2021-03-01 LAB — PREPARE RBC (CROSSMATCH)

## 2021-03-01 LAB — ABO/RH: ABO/RH(D): O NEG

## 2021-03-01 SURGERY — DILATION AND CURETTAGE
Anesthesia: General

## 2021-03-01 MED ORDER — MIDAZOLAM HCL 5 MG/5ML IJ SOLN
INTRAMUSCULAR | Status: DC | PRN
  Administered 2021-03-01: 2 mg via INTRAVENOUS

## 2021-03-01 MED ORDER — ROCURONIUM BROMIDE 10 MG/ML (PF) SYRINGE
PREFILLED_SYRINGE | INTRAVENOUS | Status: DC | PRN
  Administered 2021-03-01: 60 mg via INTRAVENOUS
  Administered 2021-03-01: 20 mg via INTRAVENOUS

## 2021-03-01 MED ORDER — ONDANSETRON HCL 4 MG/2ML IJ SOLN
4.0000 mg | Freq: Four times a day (QID) | INTRAMUSCULAR | Status: DC | PRN
Start: 2021-03-01 — End: 2021-03-03

## 2021-03-01 MED ORDER — 0.9 % SODIUM CHLORIDE (POUR BTL) OPTIME
TOPICAL | Status: DC | PRN
  Administered 2021-03-01: 1000 mL

## 2021-03-01 MED ORDER — MENTHOL 3 MG MT LOZG
1.0000 | LOZENGE | OROMUCOSAL | Status: DC | PRN
  Filled 2021-03-01: qty 9

## 2021-03-01 MED ORDER — ONDANSETRON HCL 4 MG/2ML IJ SOLN
4.0000 mg | Freq: Four times a day (QID) | INTRAMUSCULAR | Status: DC | PRN
  Administered 2021-03-02: 4 mg via INTRAVENOUS
  Filled 2021-03-01: qty 2

## 2021-03-01 MED ORDER — PHENYLEPHRINE 40 MCG/ML (10ML) SYRINGE FOR IV PUSH (FOR BLOOD PRESSURE SUPPORT)
PREFILLED_SYRINGE | INTRAVENOUS | Status: AC
  Filled 2021-03-01: qty 10

## 2021-03-01 MED ORDER — ARTIFICIAL TEARS OPHTHALMIC OINT
TOPICAL_OINTMENT | OPHTHALMIC | Status: AC
  Filled 2021-03-01: qty 3.5

## 2021-03-01 MED ORDER — VASOPRESSIN 20 UNIT/ML IV SOLN
INTRAVENOUS | Status: DC | PRN
  Administered 2021-03-01: 50 mL via INTRAMUSCULAR

## 2021-03-01 MED ORDER — MIDAZOLAM HCL 2 MG/2ML IJ SOLN
INTRAMUSCULAR | Status: AC
  Filled 2021-03-01: qty 2

## 2021-03-01 MED ORDER — ONDANSETRON HCL 4 MG/2ML IJ SOLN
INTRAMUSCULAR | Status: DC | PRN
  Administered 2021-03-01: 4 mg via INTRAVENOUS

## 2021-03-01 MED ORDER — OXYCODONE HCL 5 MG/5ML PO SOLN
5.0000 mg | Freq: Once | ORAL | Status: DC | PRN
Start: 2021-03-01 — End: 2021-03-01

## 2021-03-01 MED ORDER — NALOXONE HCL 0.4 MG/ML IJ SOLN: 0.4000 mg | INTRAMUSCULAR | Status: DC | PRN

## 2021-03-01 MED ORDER — OXYCODONE HCL 5 MG PO TABS
5.0000 mg | ORAL_TABLET | ORAL | Status: DC | PRN
  Administered 2021-03-01 – 2021-03-03 (×2): 10 mg via ORAL
  Filled 2021-03-01 (×2): qty 2

## 2021-03-01 MED ORDER — DIPHENHYDRAMINE HCL 12.5 MG/5ML PO ELIX
12.5000 mg | ORAL_SOLUTION | Freq: Four times a day (QID) | ORAL | Status: DC | PRN
  Filled 2021-03-01: qty 5

## 2021-03-01 MED ORDER — ALBUMIN HUMAN 5 % IV SOLN: INTRAVENOUS | Status: DC | PRN

## 2021-03-01 MED ORDER — POTASSIUM CHLORIDE IN NACL 20-0.9 MEQ/L-% IV SOLN
INTRAVENOUS | Status: DC
  Filled 2021-03-01 (×5): qty 1000

## 2021-03-01 MED ORDER — OXYCODONE HCL 5 MG PO TABS
5.0000 mg | ORAL_TABLET | Freq: Once | ORAL | Status: DC | PRN
Start: 2021-03-01 — End: 2021-03-01

## 2021-03-01 MED ORDER — SODIUM CHLORIDE 0.9% FLUSH: 9.0000 mL | INTRAVENOUS | Status: DC | PRN

## 2021-03-01 MED ORDER — DEXAMETHASONE SODIUM PHOSPHATE 10 MG/ML IJ SOLN
INTRAMUSCULAR | Status: AC
  Filled 2021-03-01: qty 1

## 2021-03-01 MED ORDER — CEFAZOLIN SODIUM-DEXTROSE 2-3 GM-%(50ML) IV SOLR
INTRAVENOUS | Status: DC | PRN
  Administered 2021-03-01: 2 g via INTRAVENOUS

## 2021-03-01 MED ORDER — LIDOCAINE 2% (20 MG/ML) 5 ML SYRINGE
INTRAMUSCULAR | Status: DC | PRN
Start: 2021-03-01 — End: 2021-03-01
  Administered 2021-03-01: 60 mg via INTRAVENOUS

## 2021-03-01 MED ORDER — PROPOFOL 10 MG/ML IV BOLUS
INTRAVENOUS | Status: DC | PRN
  Administered 2021-03-01: 140 mg via INTRAVENOUS

## 2021-03-01 MED ORDER — ACETAMINOPHEN 500 MG PO TABS: 1000.0000 mg | ORAL_TABLET | Freq: Once | ORAL | Status: DC | PRN

## 2021-03-01 MED ORDER — ORAL CARE MOUTH RINSE: 15.0000 mL | Freq: Once | OROMUCOSAL | Status: AC

## 2021-03-01 MED ORDER — POLYETHYLENE GLYCOL 3350 17 G PO PACK: 17.0000 g | PACK | Freq: Every day | ORAL | Status: DC | PRN

## 2021-03-01 MED ORDER — CHLORHEXIDINE GLUCONATE 0.12 % MT SOLN
15.0000 mL | Freq: Once | OROMUCOSAL | Status: AC
  Administered 2021-03-01: 15 mL via OROMUCOSAL

## 2021-03-01 MED ORDER — FENTANYL CITRATE (PF) 250 MCG/5ML IJ SOLN
INTRAMUSCULAR | Status: DC | PRN
  Administered 2021-03-01 (×2): 50 ug via INTRAVENOUS
  Administered 2021-03-01 (×2): 25 ug via INTRAVENOUS
  Administered 2021-03-01: 100 ug via INTRAVENOUS

## 2021-03-01 MED ORDER — ACETAMINOPHEN 160 MG/5ML PO SOLN: 1000.0000 mg | Freq: Once | ORAL | Status: DC | PRN

## 2021-03-01 MED ORDER — LACTATED RINGERS IV SOLN: INTRAVENOUS | Status: DC | PRN

## 2021-03-01 MED ORDER — SODIUM CHLORIDE 0.9% IV SOLUTION: Freq: Once | INTRAVENOUS | Status: DC

## 2021-03-01 MED ORDER — HYDROMORPHONE 1 MG/ML IV SOLN
INTRAVENOUS | Status: DC
  Administered 2021-03-02: 5.4 mg via INTRAVENOUS
  Administered 2021-03-02: 1.2 mg via INTRAVENOUS
  Administered 2021-03-03 (×2): 0 mg via INTRAVENOUS
  Administered 2021-03-03: 0.6 mg via INTRAVENOUS
  Administered 2021-03-03: 0.9 mg via INTRAVENOUS
  Administered 2021-03-03: 1.2 mg via INTRAVENOUS
  Filled 2021-03-01: qty 30

## 2021-03-01 MED ORDER — LACTATED RINGERS IV SOLN: INTRAVENOUS | Status: DC

## 2021-03-01 MED ORDER — MORPHINE SULFATE (PF) 2 MG/ML IV SOLN
1.0000 mg | INTRAVENOUS | Status: DC | PRN
  Administered 2021-03-01: 2 mg via INTRAVENOUS
  Filled 2021-03-01: qty 1

## 2021-03-01 MED ORDER — FENTANYL CITRATE (PF) 100 MCG/2ML IJ SOLN
25.0000 ug | INTRAMUSCULAR | Status: DC | PRN
  Administered 2021-03-01 (×2): 50 ug via INTRAVENOUS

## 2021-03-01 MED ORDER — VASOPRESSIN 20 UNIT/ML IV SOLN
INTRAVENOUS | Status: AC
  Filled 2021-03-01: qty 1

## 2021-03-01 MED ORDER — ACETAMINOPHEN 10 MG/ML IV SOLN: 1000.0000 mg | Freq: Once | INTRAVENOUS | Status: DC | PRN

## 2021-03-01 MED ORDER — FENTANYL CITRATE (PF) 100 MCG/2ML IJ SOLN
INTRAMUSCULAR | Status: AC
  Filled 2021-03-01: qty 2

## 2021-03-01 MED ORDER — ONDANSETRON HCL 4 MG PO TABS: 4.0000 mg | ORAL_TABLET | Freq: Four times a day (QID) | ORAL | Status: DC | PRN

## 2021-03-01 MED ORDER — PHENYLEPHRINE 40 MCG/ML (10ML) SYRINGE FOR IV PUSH (FOR BLOOD PRESSURE SUPPORT)
PREFILLED_SYRINGE | INTRAVENOUS | Status: DC | PRN
  Administered 2021-03-01: 40 ug via INTRAVENOUS
  Administered 2021-03-01: 80 ug via INTRAVENOUS
  Administered 2021-03-01 (×2): 40 ug via INTRAVENOUS

## 2021-03-01 MED ORDER — DIPHENHYDRAMINE HCL 50 MG/ML IJ SOLN: 12.5000 mg | Freq: Four times a day (QID) | INTRAMUSCULAR | Status: DC | PRN

## 2021-03-01 MED ORDER — ACETAMINOPHEN 500 MG PO TABS
1000.0000 mg | ORAL_TABLET | Freq: Four times a day (QID) | ORAL | Status: DC
  Administered 2021-03-01 – 2021-03-03 (×7): 1000 mg via ORAL
  Filled 2021-03-01 (×7): qty 2

## 2021-03-01 MED ORDER — MICROFIBRILLAR COLL HEMOSTAT EX PADS
MEDICATED_PAD | CUTANEOUS | Status: DC | PRN
  Administered 2021-03-01: 1 via TOPICAL

## 2021-03-01 MED ORDER — FENTANYL CITRATE (PF) 250 MCG/5ML IJ SOLN
INTRAMUSCULAR | Status: AC
  Filled 2021-03-01: qty 5

## 2021-03-01 MED ORDER — SUGAMMADEX SODIUM 200 MG/2ML IV SOLN
INTRAVENOUS | Status: DC | PRN
  Administered 2021-03-01 (×3): 100 mg via INTRAVENOUS

## 2021-03-01 MED ORDER — CHLORHEXIDINE GLUCONATE 0.12 % MT SOLN
OROMUCOSAL | Status: AC
  Filled 2021-03-01: qty 15

## 2021-03-01 MED ORDER — ALUM & MAG HYDROXIDE-SIMETH 200-200-20 MG/5ML PO SUSP: 30.0000 mL | ORAL | Status: DC | PRN

## 2021-03-01 MED ORDER — ONDANSETRON HCL 4 MG/2ML IJ SOLN
INTRAMUSCULAR | Status: AC
  Filled 2021-03-01: qty 2

## 2021-03-01 MED ORDER — DEXAMETHASONE SODIUM PHOSPHATE 10 MG/ML IJ SOLN
INTRAMUSCULAR | Status: DC | PRN
  Administered 2021-03-01: 10 mg via INTRAVENOUS

## 2021-03-01 MED ORDER — CEFAZOLIN SODIUM-DEXTROSE 1-4 GM/50ML-% IV SOLN
1.0000 g | Freq: Three times a day (TID) | INTRAVENOUS | Status: AC
  Administered 2021-03-02 (×3): 1 g via INTRAVENOUS
  Filled 2021-03-01 (×5): qty 50

## 2021-03-01 SURGICAL SUPPLY — 30 items
BALLN POSTPARTUM SOS BAKRI (BALLOONS) ×2
BALLOON POSTPARTUM SOS BAKRI (BALLOONS) ×1 IMPLANT
CANISTER SUCT 3000ML PPV (MISCELLANEOUS) ×2 IMPLANT
CANNULA CURETTE W/SYR 6 (CANNULA) IMPLANT
CANNULA CURETTE W/SYR 7 (CANNULA) IMPLANT
CATH FOLEY 3WAY  5CC 16FR (CATHETERS) ×1
CATH FOLEY 3WAY 30CC 22FR (CATHETERS) ×2 IMPLANT
CATH FOLEY 3WAY 5CC 16FR (CATHETERS) ×1 IMPLANT
CATH ROBINSON RED A/P 16FR (CATHETERS) ×2 IMPLANT
CNTNR URN SCR LID CUP LEK RST (MISCELLANEOUS) ×1 IMPLANT
CONT SPEC 4OZ STRL OR WHT (MISCELLANEOUS) ×1
GLOVE SURG ENC MOIS LTX SZ8 (GLOVE) ×2 IMPLANT
GLOVE SURG UNDER POLY LF SZ7 (GLOVE) ×2 IMPLANT
GLOVE SURG UNDER POLY LF SZ8.5 (GLOVE) ×2 IMPLANT
GOWN STRL REUS W/ TWL LRG LVL3 (GOWN DISPOSABLE) ×2 IMPLANT
GOWN STRL REUS W/TWL LRG LVL3 (GOWN DISPOSABLE) ×2
HEMOSTAT SURGICEL 2X4 FIBR (HEMOSTASIS) ×2 IMPLANT
KIT TURNOVER KIT B (KITS) ×2 IMPLANT
PACK VAGINAL MINOR WOMEN LF (CUSTOM PROCEDURE TRAY) ×2 IMPLANT
PAD OB MATERNITY 4.3X12.25 (PERSONAL CARE ITEMS) ×2 IMPLANT
PLUG CATH AND CAP STER (CATHETERS) ×4 IMPLANT
STENT BALLN UTERINE 3CM 6FR (STENTS) IMPLANT
STENT BALLN UTERINE 4CM 6FR (STENTS) IMPLANT
STOPCOCK 4 WAY LG BORE MALE ST (IV SETS) ×2 IMPLANT
SUT SILK 2 0 SH (SUTURE) IMPLANT
SUT VIC AB 2-0 CT1 18 (SUTURE) ×2 IMPLANT
SUT VIC AB 3-0 SH 27 (SUTURE) ×1
SUT VIC AB 3-0 SH 27X BRD (SUTURE) ×1 IMPLANT
SYR TOOMEY 50ML (SYRINGE) ×4 IMPLANT
TOWEL GREEN STERILE FF (TOWEL DISPOSABLE) ×4 IMPLANT

## 2021-03-01 NOTE — Anesthesia Procedure Notes (Signed)
Procedure Name: Intubation Date/Time: 03/01/2021 3:15 PM Performed by: Janene Harvey, CRNA Pre-anesthesia Checklist: Patient identified, Emergency Drugs available, Suction available and Patient being monitored Patient Re-evaluated:Patient Re-evaluated prior to induction Oxygen Delivery Method: Circle system utilized Preoxygenation: Pre-oxygenation with 100% oxygen Induction Type: IV induction Ventilation: Mask ventilation without difficulty Laryngoscope Size: 4 and Mac Grade View: Grade I Tube type: Oral Tube size: 7.0 mm Number of attempts: 1 Airway Equipment and Method: Stylet and Oral airway Placement Confirmation: ETT inserted through vocal cords under direct vision, positive ETCO2 and breath sounds checked- equal and bilateral Secured at: 22 cm Tube secured with: Tape Dental Injury: Teeth and Oropharynx as per pre-operative assessment

## 2021-03-01 NOTE — Transfer of Care (Signed)
Immediate Anesthesia Transfer of Care Note  Patient: Melinda Castillo  Procedure(s) Performed: DILATATION AND CURETTAGE WITH ULTRASOUND GUIDANCE  Patient Location: PACU  Anesthesia Type:General  Level of Consciousness: drowsy and patient cooperative  Airway & Oxygen Therapy: Patient Spontanous Breathing  Post-op Assessment: Report given to RN and Post -op Vital signs reviewed and stable  Post vital signs: Reviewed  Last Vitals:  Vitals Value Taken Time  BP    Temp    Pulse    Resp    SpO2      Last Pain:  Vitals:   03/01/21 1327  PainSc: 0-No pain         Complications: No notable events documented.

## 2021-03-01 NOTE — H&P (Signed)
Melinda Castillo is a 35 y.o. female , originally referred to me by Dr. Fonnie Birkenhead, for treatment of C-section scar ectopic pregnancy  She was diagnosed with C-section scar ectopic pregnancy and was also followed by maternal fetal medicine.  Upon determining certainty about the diagnosis she was given systemic methotrexate IM and was also given by me 100 mg of methotrexate into the gestational sac via transvaginal ultrasound guidance 4 days ago.  Her last hCG level is 55,000.Patient would like to preserve her childbearing potential.  Patient denies severe pain, excessive vaginal bleeding or hematuria.  Pertinent Gynecological History: Menses: flow is excessive with use of 3 pads or tampons on heaviest days Bleeding: dysfunctional uterine bleeding Contraception: none DES exposure: denies Blood transfusions: none Sexually transmitted diseases: no past history Previous GYN Procedures: C-section Last mammogram: normal Last pap: normal  OB History: C-section  Menstrual History: Menarche age: 26 No LMP recorded.    Past Medical History:  Diagnosis Date   Diabetes mellitus without complication (HCC)    Elevated liver function tests 03/01/2019   Formatting of this note might be different from the original. 02/28/2019: AST/ALT 59/60; repeat next visit. AST normal, ALT = 43 03/26/19   Essential hypertension 11/02/2016   Group beta Strep positive 09/08/2019   History of bilateral breast reduction surgery 01/06/2012   Infertility, 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 IVF pregnancy through Dr Elesa Hacker Day 5 embryo transfer. 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 Normal.     TONSILLECTOMY  2006             Family History  Problem Relation Age of Onset   Hypertension Mother    Hypertension Father    Ovarian cancer Maternal Grandmother    Early death Maternal Grandmother    Heart disease Maternal Grandfather    Brain cancer Paternal Grandfather    Stroke Paternal Grandmother    Colon cancer Neg Hx    Colon polyps Neg Hx    No hereditary disease.  No cancer of breast, ovary, uterus.   Social History   Socioeconomic History   Marital status: Married    Spouse name: Not on file   Number of children: Not on file   Years of education: Not on file   Highest education level: Not on file  Occupational History   Not on file  Tobacco Use   Smoking status: Never   Smokeless tobacco: Never  Vaping Use   Vaping Use: Never used  Substance and Sexual Activity   Alcohol use: Yes    Comment: social   Drug use: Never   Sexual activity: Yes    Partners: Male  Other Topics Concern   Not on file  Social History Narrative   Marital status/children/pets: married, 1 child.    Education/employment: Animator, works as Scientist, clinical (histocompatibility and immunogenetics) at [employer].    Safety:      -Wears a bicycle helmet riding a bike: Yes     -smoke alarm in the home:Yes     - wears seatbelt: Yes     - Feels safe  in their relationships: Yes   Social Determinants of Health   Financial Resource Strain: Not on file  Food Insecurity: Not on file  Transportation Needs: Not on file  Physical Activity: Not on file  Stress: Not on file  Social Connections: Not on file  Intimate Partner Violence: Not on file    No Known Allergies  No current facility-administered medications on file prior to encounter.   Current Outpatient Medications on File Prior to Encounter  Medication Sig Dispense Refill   Cetirizine HCl (ZYRTEC ALLERGY) 10 MG CAPS Zyrtec     metFORMIN (GLUCOPHAGE-XR) 500 MG 24 hr tablet TAKE 1 TABLET BY MOUTH EVERY MORNING AND AT BEDTIME 180 tablet 1   BD DISP NEEDLES 22G X 1-1/2"  MISC      methotrexate 50 MG/2ML injection Bring to office for administration 4 mL 0     Review of Systems  Constitutional: Negative.   HENT: Negative.   Eyes: Negative.   Respiratory: Negative.   Cardiovascular: Negative.   Gastrointestinal: Negative.   Genitourinary: Negative.   Musculoskeletal: Negative.   Skin: Negative.   Neurological: Negative.   Endo/Heme/Allergies: Negative.   Psychiatric/Behavioral: Negative.      Physical Exam  Ht 5\' 10"  (1.778 m)   Wt 104.3 kg   LMP 12/20/2020   BMI 33.00 kg/m  Constitutional: She is oriented to person, place, and time. She appears well-developed and well-nourished.  HENT:  Head: Normocephalic and atraumatic.  Nose: Nose normal.  Mouth/Throat: Oropharynx is clear and moist. No oropharyngeal exudate.  Eyes: Conjunctivae normal and EOM are normal. Pupils are equal, round, and reactive to light. No scleral icterus.  Neck: Normal range of motion. Neck supple. No tracheal deviation present. No thyromegaly present.  Cardiovascular: Normal rate.   Respiratory: Effort normal and breath sounds normal.  GI: Soft. Bowel sounds are normal. She exhibits no distension and no mass. There is no tenderness.  Lymphadenopathy:    She has no cervical adenopathy.  Neurological: She is alert and oriented to person, place, and time. She has normal reflexes.  Skin: Skin is warm.  Psychiatric: She has a normal mood and affect. Her behavior is normal. Judgment and thought content normal.       Assessment/Plan: C-section scar ectopic pregnancy at 10 weeks, status post systemic and intra-gestational sac administration of methotrexate Preop for ultrasound guidance of the C-section scar curettage, possible tamponading balloon placement  Benefits and risks of the proposed procedure were discussed with the patient and her family member again.  Bowel prep instructions were given.  All of patient's questions were answered.  She verbalized understanding.   She knows that she may need repair of the affected C-section scar defect before future pregnancies, and that it is recommended she does not conceive for 2-3 months.  for uterus to heal.

## 2021-03-01 NOTE — Anesthesia Preprocedure Evaluation (Signed)
Anesthesia Evaluation  Patient identified by MRN, date of birth, ID band Patient awake    Reviewed: Allergy & Precautions, NPO status , Patient's Chart, lab work & pertinent test results  History of Anesthesia Complications Negative for: history of anesthetic complications  Airway Mallampati: II  TM Distance: >3 FB Neck ROM: Full    Dental  (+) Dental Advisory Given, Teeth Intact   Pulmonary neg pulmonary ROS,    breath sounds clear to auscultation       Cardiovascular hypertension,  Rhythm:Regular     Neuro/Psych negative neurological ROS  negative psych ROS   GI/Hepatic negative GI ROS, Neg liver ROS,   Endo/Other  diabetes, Oral Hypoglycemic Agents  Renal/GU negative Renal ROS     Musculoskeletal negative musculoskeletal ROS (+)   Abdominal   Peds  Hematology negative hematology ROS (+)   Anesthesia Other Findings   Reproductive/Obstetrics Ectopic Pregnancy                             Anesthesia Physical Anesthesia Plan  ASA: 2  Anesthesia Plan: General   Post-op Pain Management:    Induction: Intravenous  PONV Risk Score and Plan: 3 and Ondansetron and Dexamethasone  Airway Management Planned: Oral ETT  Additional Equipment: None  Intra-op Plan:   Post-operative Plan: Extubation in OR  Informed Consent: I have reviewed the patients History and Physical, chart, labs and discussed the procedure including the risks, benefits and alternatives for the proposed anesthesia with the patient or authorized representative who has indicated his/her understanding and acceptance.     Dental advisory given  Plan Discussed with: CRNA and Anesthesiologist  Anesthesia Plan Comments:         Anesthesia Quick Evaluation

## 2021-03-01 NOTE — Progress Notes (Addendum)
Pt c/o sharp stabbing pain in lower abdominal pelvic area,  with new bleeding. 2mg  morphine given with little relief from a 10 to 8 BP 117/87 (BP Location: Right Arm)   Pulse 85   Temp 98.3 F (36.8 C) (Oral)   Resp 18   Ht 5\' 10"  (1.778 m)   Wt 104.3 kg   LMP 12/20/2020   SpO2 98%   BMI 33.00 kg/m   Dr. notifed, will start pt on PCA pump and ultrasound will help with getting a better picture of any complications Will continue to monitor blood output and pain. Recent pad was 1/3 covered in blood.  12/22/2020 03/01/21 8:49 PM

## 2021-03-01 NOTE — OR Nursing (Signed)
Only Dr. April Manson will touched the 3 way foley that is in the uterus.Instruction given to the PACU nurse.

## 2021-03-01 NOTE — Anesthesia Postprocedure Evaluation (Signed)
Anesthesia Post Note  Patient: Melinda Castillo  Procedure(s) Performed: DILATATION AND CURETTAGE WITH ULTRASOUND GUIDANCE     Patient location during evaluation: PACU Anesthesia Type: General Level of consciousness: awake and alert Pain management: pain level controlled Vital Signs Assessment: post-procedure vital signs reviewed and stable Respiratory status: spontaneous breathing, nonlabored ventilation, respiratory function stable and patient connected to nasal cannula oxygen Cardiovascular status: blood pressure returned to baseline and stable Postop Assessment: no apparent nausea or vomiting Anesthetic complications: no   No notable events documented.  Last Vitals:  Vitals:   03/01/21 1735 03/01/21 1750  BP: 115/75 116/84  Pulse: 72 74  Resp: 20 20  Temp:  (!) 36.1 C  SpO2: 99% 100%    Last Pain:  Vitals:   03/01/21 1750  PainSc: 5                  Madelaine Whipple

## 2021-03-01 NOTE — Op Note (Signed)
OPERATIVE NOTE  Preoperative diagnosis: C/Section scar ectopic pregnancy at 10 weeks, S/P systemic and intra-gestational methorexate administration  Postoperative diagnosis: C/Section scar ectopic pregnancy at 10 weeks, S/P systemic and intra-gestational methorexate administration  Procedure: Suction D. and C. Under transabdominal ultrasound guidance, intrauterine (scar site)   Anesthesia: General   Surgeon: [name], MD  Complications: None   Estimated blood loss: 500 mL  Specimen: POC and intra uterine curettings to pathology  Description of the procedure: Patient was placed in lithotomy position and general anesthesia was given. 2 g of cefazolin were given intravenously for prophylaxis. She she was prepped and draped in a sterile manner. A 3-way Foley catheter was placed into the bladder. Anterior cervical lip was grasped with a tenaculum.  Dilute vasopressin was injected into the cervix and cervix was serially dilated to 41 F with Pratt dilators. Transabdominal ultrasound confirmed nonviable gestation (mass measuring 4 x 3 cm in its entirety) in the C/S scar.  A size 8, and then 10 mm curved suction curet was used to reach the implantation site under TAUS guidance and the products of conception and clots in the scar was pulled into the curette. Upper uterine cavity was separately curetted.  Next we used a medium sized sharp curette to scrape the C/S scar site under TAUS guidance.  Brisk bleeding from the cervical canal was noted without any accumulation of fluid by TAUS in the pelvis or bladder and the curettage procedure was terminated.  The balloon of a 68F Foley  was inflated gradually to 50 cc with sterile water (to a diameter of 5.0 cm) under TAUS guidance in order to tamponade the bleeding but some bleeding continued when the lumen of of the catheter was blocked. We noticed no expansion of intrauterine fluid or fluid around the balloon when the Foley was capped therefore kept  it occluded. Meanwhile we consulted Dr Fredia Sorrow from IR, who advised careful observation was more appropriate, rather than immediate bilateral uterine artery embolization attempt, for technical reasons.  Procedure was terminated after two figure of eight sutures were placed on the cervical laceration with 2-0 Vicryl.  The patient tolerated the procedure well and was transferred to recovery in satisfactory condition.   [name]

## 2021-03-02 ENCOUNTER — Observation Stay (HOSPITAL_COMMUNITY): Payer: 59

## 2021-03-02 DIAGNOSIS — Z808 Family history of malignant neoplasm of other organs or systems: Secondary | ICD-10-CM | POA: Diagnosis not present

## 2021-03-02 DIAGNOSIS — Z8041 Family history of malignant neoplasm of ovary: Secondary | ICD-10-CM | POA: Diagnosis not present

## 2021-03-02 DIAGNOSIS — O99011 Anemia complicating pregnancy, first trimester: Secondary | ICD-10-CM | POA: Diagnosis present

## 2021-03-02 DIAGNOSIS — N99820 Postprocedural hemorrhage and hematoma of a genitourinary system organ or structure following a genitourinary system procedure: Secondary | ICD-10-CM | POA: Diagnosis not present

## 2021-03-02 DIAGNOSIS — N9489 Other specified conditions associated with female genital organs and menstrual cycle: Secondary | ICD-10-CM | POA: Diagnosis present

## 2021-03-02 DIAGNOSIS — Z8249 Family history of ischemic heart disease and other diseases of the circulatory system: Secondary | ICD-10-CM | POA: Diagnosis not present

## 2021-03-02 DIAGNOSIS — Z20822 Contact with and (suspected) exposure to covid-19: Secondary | ICD-10-CM | POA: Diagnosis present

## 2021-03-02 DIAGNOSIS — Z79899 Other long term (current) drug therapy: Secondary | ICD-10-CM | POA: Diagnosis not present

## 2021-03-02 DIAGNOSIS — O09819 Supervision of pregnancy resulting from assisted reproductive technology, unspecified trimester: Secondary | ICD-10-CM | POA: Diagnosis not present

## 2021-03-02 DIAGNOSIS — O008 Other ectopic pregnancy without intrauterine pregnancy: Secondary | ICD-10-CM | POA: Diagnosis present

## 2021-03-02 DIAGNOSIS — Z7984 Long term (current) use of oral hypoglycemic drugs: Secondary | ICD-10-CM | POA: Diagnosis not present

## 2021-03-02 DIAGNOSIS — O24911 Unspecified diabetes mellitus in pregnancy, first trimester: Secondary | ICD-10-CM | POA: Diagnosis present

## 2021-03-02 DIAGNOSIS — R7611 Nonspecific reaction to tuberculin skin test without active tuberculosis: Secondary | ICD-10-CM | POA: Diagnosis present

## 2021-03-02 DIAGNOSIS — O161 Unspecified maternal hypertension, first trimester: Secondary | ICD-10-CM | POA: Diagnosis present

## 2021-03-02 DIAGNOSIS — D500 Iron deficiency anemia secondary to blood loss (chronic): Secondary | ICD-10-CM | POA: Diagnosis present

## 2021-03-02 DIAGNOSIS — Y839 Surgical procedure, unspecified as the cause of abnormal reaction of the patient, or of later complication, without mention of misadventure at the time of the procedure: Secondary | ICD-10-CM | POA: Diagnosis not present

## 2021-03-02 DIAGNOSIS — O0901 Supervision of pregnancy with history of infertility, first trimester: Secondary | ICD-10-CM | POA: Diagnosis not present

## 2021-03-02 DIAGNOSIS — Z823 Family history of stroke: Secondary | ICD-10-CM | POA: Diagnosis not present

## 2021-03-02 DIAGNOSIS — Z3A13 13 weeks gestation of pregnancy: Secondary | ICD-10-CM | POA: Diagnosis not present

## 2021-03-02 HISTORY — PX: IR EMBO ART  VEN HEMORR LYMPH EXTRAV  INC GUIDE ROADMAPPING: IMG5450

## 2021-03-02 HISTORY — PX: IR US GUIDE VASC ACCESS RIGHT: IMG2390

## 2021-03-02 LAB — CBC
HCT: 25.2 % — ABNORMAL LOW (ref 36.0–46.0)
HCT: 27 % — ABNORMAL LOW (ref 36.0–46.0)
Hemoglobin: 8.5 g/dL — ABNORMAL LOW (ref 12.0–15.0)
Hemoglobin: 9.2 g/dL — ABNORMAL LOW (ref 12.0–15.0)
MCH: 29.9 pg (ref 26.0–34.0)
MCH: 29.9 pg (ref 26.0–34.0)
MCHC: 33.7 g/dL (ref 30.0–36.0)
MCHC: 34.1 g/dL (ref 30.0–36.0)
MCV: 87.7 fL (ref 80.0–100.0)
MCV: 88.7 fL (ref 80.0–100.0)
Platelets: 139 10*3/uL — ABNORMAL LOW (ref 150–400)
Platelets: 141 10*3/uL — ABNORMAL LOW (ref 150–400)
RBC: 2.84 MIL/uL — ABNORMAL LOW (ref 3.87–5.11)
RBC: 3.08 MIL/uL — ABNORMAL LOW (ref 3.87–5.11)
RDW: 12.7 % (ref 11.5–15.5)
RDW: 12.9 % (ref 11.5–15.5)
WBC: 5.3 10*3/uL (ref 4.0–10.5)
WBC: 5.4 10*3/uL (ref 4.0–10.5)
nRBC: 0 % (ref 0.0–0.2)
nRBC: 0 % (ref 0.0–0.2)

## 2021-03-02 LAB — HCG, QUANTITATIVE, PREGNANCY: hCG, Beta Chain, Quant, S: 20577 m[IU]/mL — ABNORMAL HIGH (ref <5)

## 2021-03-02 MED ORDER — KETOROLAC TROMETHAMINE 30 MG/ML IJ SOLN
INTRAMUSCULAR | Status: AC
  Filled 2021-03-02: qty 1

## 2021-03-02 MED ORDER — MIDAZOLAM HCL 2 MG/2ML IJ SOLN
INTRAMUSCULAR | Status: AC
  Filled 2021-03-02: qty 4

## 2021-03-02 MED ORDER — LIDOCAINE HCL (PF) 1 % IJ SOLN
INTRAMUSCULAR | Status: AC | PRN
  Administered 2021-03-02: 10 mL

## 2021-03-02 MED ORDER — CHLORHEXIDINE GLUCONATE CLOTH 2 % EX PADS
6.0000 | MEDICATED_PAD | Freq: Every day | CUTANEOUS | Status: DC
  Administered 2021-03-02 – 2021-03-03 (×2): 6 via TOPICAL

## 2021-03-02 MED ORDER — FENTANYL CITRATE (PF) 100 MCG/2ML IJ SOLN
INTRAMUSCULAR | Status: AC | PRN
  Administered 2021-03-02: 25 ug via INTRAVENOUS
  Administered 2021-03-02 (×3): 50 ug via INTRAVENOUS

## 2021-03-02 MED ORDER — IOHEXOL 240 MG/ML SOLN
50.0000 mL | Freq: Once | INTRAMUSCULAR | Status: AC
  Administered 2021-03-02: 100 mL via INTRAVENOUS

## 2021-03-02 MED ORDER — GELATIN ABSORBABLE 12-7 MM EX MISC
CUTANEOUS | Status: AC
  Filled 2021-03-02: qty 2

## 2021-03-02 MED ORDER — HYDROMORPHONE HCL 1 MG/ML IJ SOLN
INTRAMUSCULAR | Status: AC
  Administered 2021-03-02: 1 mg via INTRAVENOUS
  Filled 2021-03-02: qty 1

## 2021-03-02 MED ORDER — KETOROLAC TROMETHAMINE 30 MG/ML IJ SOLN
30.0000 mg | INTRAMUSCULAR | Status: AC
  Administered 2021-03-02: 30 mg via INTRAVENOUS

## 2021-03-02 MED ORDER — FENTANYL CITRATE (PF) 100 MCG/2ML IJ SOLN
INTRAMUSCULAR | Status: AC
  Filled 2021-03-02: qty 4

## 2021-03-02 MED ORDER — HYDROMORPHONE HCL 1 MG/ML IJ SOLN: 1.0000 mg | INTRAMUSCULAR | Status: AC

## 2021-03-02 MED ORDER — MIDAZOLAM HCL 2 MG/2ML IJ SOLN
INTRAMUSCULAR | Status: AC | PRN
Start: 2021-03-02 — End: 2021-03-02
  Administered 2021-03-02 (×2): 0.5 mg via INTRAVENOUS
  Administered 2021-03-02 (×2): 1 mg via INTRAVENOUS
  Administered 2021-03-02: 0.5 mg via INTRAVENOUS

## 2021-03-02 MED ORDER — LIDOCAINE HCL 1 % IJ SOLN
INTRAMUSCULAR | Status: AC
  Filled 2021-03-02: qty 20

## 2021-03-02 MED ORDER — GELATIN ABSORBABLE 12-7 MM EX MISC
CUTANEOUS | Status: AC
  Filled 2021-03-02: qty 1

## 2021-03-02 MED ORDER — KETOROLAC TROMETHAMINE 15 MG/ML IJ SOLN
15.0000 mg | Freq: Three times a day (TID) | INTRAMUSCULAR | Status: DC
  Administered 2021-03-03 (×3): 15 mg via INTRAVENOUS
  Filled 2021-03-02 (×3): qty 1

## 2021-03-02 NOTE — Procedures (Signed)
Interventional Radiology Procedure Note  Procedure:   US guided access right CFA.  Pelvic angiogram. Bilateral uterine artery embolization with gelfoam to stasis.  Exoseal for hemostasis.  .  Complications: None  Recommendations:  - Right hip straight x 4 hours - Do not submerge for 7 days - routine wound care - continue to observe H&H - expect symptoms of post-embolic syndrome, to include: pelvic discomfort/pain x 24-48hrs Low grade fever Leukocytosis expected, if labs were drawn.  No need from VIR standpoint   Signed,  Yvone Neu. Loreta Ave, DO

## 2021-03-02 NOTE — Progress Notes (Signed)
Pt arrived from IR, VSS. CHG complete, orders released, groin level 0.   Kalman Jewels, RN

## 2021-03-02 NOTE — Sedation Documentation (Signed)
Called [facility] for report. Will need a different bed request due to patient unable to go back due to right groin puncture.

## 2021-03-02 NOTE — Progress Notes (Signed)
Pt has had 5 pads changed from her peri area, each had dried blood covering approximately 1/3 of the pad. No S/S of hemorraghe BP 112/74 (BP Location: Right Arm)   Pulse 63   Temp 98.5 F (36.9 C) (Oral)   Resp 18   Ht 5\' 10"  (1.778 m)   Wt 104.3 kg   LMP 12/20/2020   SpO2 98%   BMI 33.00 kg/m  WNL 12/22/2020 03/02/21 6:47 AM

## 2021-03-02 NOTE — Consult Note (Signed)
Chief Complaint: Patient was seen in consultation today for pelvic arteriogram with possible embolization at the request of Dr April MansonYalcinkaya   Supervising Physician: Gilmer MorWagner, Jaime  [facility]  History of Present Illness: Melinda SnooksJennifer Castillo is a 35 y.o. female   C section /ectopic pregnancy 10 weeks ago Methotrexate administration D/C 03/01/21 Brisk bleeding per notes Foley inflated for balloon tamponade in uterus  Hg dropping (10.0- 8.5)  Request made for embolization and evaluation per Dr April MansonYalcinkaya  IMPRESSION: Measurements: 9.7 x 6.9 x 8.2 cm = volume: 290.9 mL. Limited assessment due to transabdominal technique and body habitus. By visual inspection, there appears to be increased distance between the Foley balloon and the posterior wall of the bladder on sagittal images as compared with the intraoperative images. Suspected hypoechoic collection superior to the Foley balloon measuring 5.3 x 5.3 x 7 cm. 1. Technically difficult study secondary to habitus and transabdominal technique 2. A Foley catheter is noted within the uterus. Suspect that there is increased distance between Foley balloon and posterior wall of bladder as compared with intraoperative views suggesting the presence of a hematoma. There may be additional hypoechoic collection/hematoma superior to the Foley balloon. There is a small amount of fluid adjacent to the liver and within the pelvis between the bladder and uterus. Contrast-enhanced CT could be helpful to assess extent of suspected hematoma and to assess for any extravasation.   Dr Loreta AveWagner has reviewed images and chart Approves procedure  Past Medical History:  Diagnosis Date   Diabetes mellitus without complication (HCC)    Elevated liver function tests 03/01/2019   Formatting of this note might be different from the original. 02/28/2019: AST/ALT 59/60; repeat next visit. AST normal, ALT = 43 03/26/19   Essential hypertension  11/02/2016   Group beta Strep positive 09/08/2019   History of bilateral breast reduction surgery 01/06/2012   Infertility, 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 IVF pregnancy through Dr Elesa Hackereaton Day 5 embryo transfer. 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 Normal.    TONSILLECTOMY  2006    Allergies: Patient has no known allergies.  Medications: Prior to Admission medications   Medication Sig Start Date End Date Taking? Authorizing Provider  Cetirizine HCl (ZYRTEC ALLERGY) 10 MG CAPS Take 10 mg by mouth daily as needed.   Yes [provider]  doxycycline (VIBRAMYCIN) 100 MG capsule Take 100 mg by mouth 2 (two) times daily. Start date: 02/23/21 02/23/21  Yes [provider]  metFORMIN (GLUCOPHAGE-XR) 500 MG 24 hr tablet TAKE 1 TABLET BY MOUTH EVERY MORNING AND AT BEDTIME Patient taking differently: Take 1,000 mg by mouth daily. 11/27/20 11/27/21 Yes Kuneff, Renee A, DO  BD DISP NEEDLES 22G X 1-1/2" MISC  09/19/20   [provider]  methotrexate 50 MG/2ML injection Bring to office for administration Patient not taking: No sig reported 02/22/21   Fermin SchwabYalcinkaya, Tamer, MD     Family History  Problem Relation Age of Onset   Hypertension Mother    Hypertension Father    Ovarian cancer Maternal Grandmother    Early death Maternal Grandmother    Heart disease Maternal Grandfather    Brain cancer  Paternal Grandfather    Stroke Paternal Grandmother    Colon cancer Neg Hx    Colon polyps Neg Hx     Social History   Socioeconomic History   Marital status: Married    Spouse name: Not on file   Number of children: Not on file   Years of education: Not on file   Highest  education level: Not on file  Occupational History   Not on file  Tobacco Use   Smoking status: Never   Smokeless tobacco: Never  Vaping Use   Vaping Use: Never used  Substance and Sexual Activity   Alcohol use: Yes    Comment: social   Drug use: Never   Sexual activity: Yes    Partners: Male  Other Topics Concern   Not on file  Social History Narrative   Marital status/children/pets: married, 1 child.    Education/employment: Animator, works as Scientist, clinical (histocompatibility and immunogenetics) at [employer].    Safety:      -Wears a bicycle helmet riding a bike: Yes     -smoke alarm in the home:Yes     - wears seatbelt: Yes     - Feels safe in their relationships: Yes   Social Determinants of Health   Financial Resource Strain: Not on file  Food Insecurity: Not on file  Transportation Needs: Not on file  Physical Activity: Not on file  Stress: Not on file  Social Connections: Not on file    Review of Systems: A 12 point ROS discussed and pertinent positives are indicated in the HPI above.  All other systems are negative.  Review of Systems  Constitutional:  Positive for activity change.  Cardiovascular:  Negative for chest pain.  Gastrointestinal:  Positive for abdominal pain.  Psychiatric/Behavioral:  Negative for behavioral problems and confusion.    Vital Signs: BP 121/75 (BP Location: Right Arm)   Pulse 70   Temp 98.4 F (36.9 C) (Oral)   Resp 16   Ht  (1.778 m)   Wt 230 lb (104.3 kg)   LMP 12/20/2020   SpO2 99%   BMI 33.00 kg/m   Physical Exam Vitals reviewed.  HENT:     Mouth/Throat:     Mouth: Mucous membranes are moist.  Cardiovascular:     Rate and Rhythm: Normal rate and regular rhythm.     Heart sounds: Normal heart sounds.  Pulmonary:     Effort: Pulmonary effort is normal.     Breath sounds: Normal breath sounds.  Abdominal:     Tenderness: There is abdominal tenderness.  Skin:    General: Skin is warm.  Neurological:     Mental Status: She is alert and oriented  to person, place, and time.  Psychiatric:        Behavior: Behavior normal.    Imaging: US PELVIS (TRANSABDOMINAL ONLY)  Result Date: 03/01/2021 CLINICAL DATA:  Recent D and C for scar ectopic, assess for hematoma EXAM: TRANSABDOMINAL ULTRASOUND OF PELVIS TECHNIQUE: Transabdominal ultrasound examination of the pelvis was performed including evaluation of the uterus, ovaries, adnexal regions, and pelvic cul-de-sac. COMPARISON:  Limited intraoperative ultrasound images 03/01/2021 FINDINGS: Uterus Measurements: 9.7 x 6.9 x 8.2 cm = volume: 290.9 mL. Limited assessment due to transabdominal technique and body habitus. By visual inspection, there appears to be increased distance between the Foley balloon and the posterior wall of the bladder on sagittal images as compared with the intraoperative images. Suspected hypoechoic collection superior to the Foley balloon measuring 5.3 x 5.3  x 7 cm. Endometrium Unable to measure.  Foley catheter is present within the uterus. Right ovary Not seen Left ovary Not seen Other findings: There is fluid between the uterus and bladder in the low pelvis. There is a small amount of fluid adjacent to the liver. IMPRESSION: 1. Technically difficult study secondary to habitus and transabdominal technique 2. A Foley catheter is noted within the uterus. Suspect that there is increased distance between Foley balloon and posterior wall of bladder as compared with intraoperative views suggesting the presence of a hematoma. There may be additional hypoechoic collection/hematoma superior to the Foley balloon. There is a small amount of fluid adjacent to the liver and within the pelvis between the bladder and uterus. Contrast-enhanced CT could be helpful to assess extent of suspected hematoma and to assess for any extravasation. These results will be called to the ordering clinician or representative by the Radiologist Assistant, and communication documented in the PACS or [employer].  Electronically Signed   By: Jasmine Pang M.D.   On: 03/01/2021 23:59   [place] Intraoperative  Result Date: 03/01/2021 CLINICAL DATA:  Ultrasound was provided for use by the ordering physician.  No provider Interpretation or professional fees incurred.    [place] MFM OB Transvaginal  Result Date: 02/19/2021 ----------------------------------------------------------------------  OBSTETRICS REPORT                       (Signed Final 02/19/2021 05:24 pm) ---------------------------------------------------------------------- Patient Info  ID #:       [id number]                          D.O.B.:  [birth year] (34 yrs)  Name:       Melinda Castillo               Visit Date: 02/19/2021 03:34 pm ---------------------------------------------------------------------- Performed By  Attending:        Noralee Space MD        Ref. Address:     [address]                                                             [address]  Performed By:     Eden Lathe BS      [facility]  Referred By:      Mitchel Honour                    DO ---------------------------------------------------------------------- Orders  #  Description                           Code        Ordered By  1  [place] MFM OB TRANSVAGINAL                [phone]     Noralee Space ----------------------------------------------------------------------  #  Order #                     Accession #                Episode #  1  [id number]                   [id number]                 [id number] ---------------------------------------------------------------------- Indications  [redacted] weeks gestation of pregnancy                 Z3A.08  Evaluation for a suspected ectopic             Z32.00  pregnancy (cesarean scar pregnancy)  Previous cesarean  delivery, antepartum         O88.219  Advanced maternal age multigravida 1+,        O69.521  first trimester ---------------------------------------------------------------------- Fetal Evaluation  Num Of Fetuses:         1  Preg. Location:         Intrauterine  Gest. Sac:              Intrauterine  Yolk Sac:               Visualized  Fetal Pole:             Visualized  Fetal Heart Rate(bpm):  177  Cardiac Activity:       Observed  Placenta:               See comments  Amniotic Fluid  AFI FV:      Within normal limits ---------------------------------------------------------------------- Biometry  CRL:        20  mm     G. Age:  8w 3d                   EDD:   09/28/21 ---------------------------------------------------------------------- OB History  Gravidity:    2         Term:   1  Living:       1 ---------------------------------------------------------------------- Gestational Age  LMP:           8w 5d         Date:  12/20/20                 EDD:   09/26/21  Best:          8w 5d      Det. By:  LMP  (12/20/20)          EDD:   09/26/21 ---------------------------------------------------------------------- Cervix Uterus Adnexa  Cervix  Closed ---------------------------------------------------------------------- Impression  Patient with suspected cesarean scar pregnancy returned for  ultrasound evaluation. She denies heavy vaginal bleeding but  had some brownish discharge.  We performed transvaginal ultrasound with partially-full  bladder.  -A single  intrauterine pregnancy with CRL measurement  consistent with 8 weeks' gestation is seen. Good fetal heart  activity is seen. Yolk sac is visible.  -Gestational sac is in the lower uterus. Implantation of the sac  is in the vicinity of scar. Myometrium is thinned out and  lacking in some areas suggesting scar pregnancy. Placental  invasion is close or into the "niche."  -Bladder wall appears intact and there is no invasion of  bladder wall by the gestational scan.   -The cervix appears normal with no invasion of placenta into  the cervix.  I counseled the patient that the findings are consistent with  cesarean scar pregnancy.  Continuation of pregnancy is associated with increased  maternal complications. Outcomes cannot be predicted and  includes placenta accreta spectrum that may lead to  hysterectomy at variable gestational age.  I recommended termination of pregnancy. I had earlier  discussed with Dr. April Manson who has considerable  experience in the management of scar pregnancy. He may  offer local or systemic methotrexate followed by vacuum  aspiration.  I discussed the findings with Dr. Langston Masker who will call the  patient today. She will set up an appointment with Dr.  April Manson if the patient wishes to proceed with termination of  pregnancy. ----------------------------------------------------------------------                  Noralee Space, MD Electronically Signed Final Report   02/19/2021 05:24 pm ----------------------------------------------------------------------  [place] MFM OB Transvaginal  Result Date: 02/03/2021 ----------------------------------------------------------------------  OBSTETRICS REPORT                       (Signed Final 02/03/2021 05:57 pm) ---------------------------------------------------------------------- Patient Info  ID #:       [id number]                          D.O.B.:  09-27-1985 (34 yrs)  Name:       Melinda Castillo               Visit Date: 02/03/2021 08:41 am ---------------------------------------------------------------------- Performed By  Attending:        Noralee Space MD        Ref. Address:     [address]                                                             [address]  Performed By:     Fayne Norrie BS,      [facility]                                [facility]  Referred By:      Mitchel Honour                    DO ---------------------------------------------------------------------- Orders  #  Description                           Code        Ordered By  1  [place] MFM OB TRANSVAGINAL                [phone]     MEGAN MORRIS ----------------------------------------------------------------------  #  Order #                     Accession #                Episode #  1  [id number]                   [id number]                 [id number] ---------------------------------------------------------------------- Indications  Less than [redacted] weeks gestation of pregnancy       Z3A.01  Evaluation for a suspected ectopic             Z32.00  pregnancy (cesarean scar pregnancy)  Previous cesarean delivery, antepartum         O34.219 ---------------------------------------------------------------------- Fetal Evaluation  Num Of Fetuses:         1  Fetal Heart Rate(bpm):  109  Cardiac Activity:       Observed  Amniotic Fluid  AFI FV:      Within normal limits  Comment:    SCH superior to the gestational sac. ---------------------------------------------------------------------- Biometry  CRL:       2.4  mm     G. Age:  5w 5d                   EDD:   10/01/21 ---------------------------------------------------------------------- OB History  Gravidity:    2         Term:   1  Living:       1 ---------------------------------------------------------------------- Gestational Age  LMP:           6w 3d         Date:  12/20/20                 EDD:   09/26/21  Best:          6w 3d      Det. By:  LMP  (12/20/20)          EDD:   09/26/21 ---------------------------------------------------------------------- Cervix Uterus Adnexa  Cervix  Closed  Right Ovary  Within normal limits.  Left Ovary  Within normal limits.  Cul De Sac  No free fluid seen. ---------------------------------------------------------------------- Impression  We performed a transabdominal and  transvaginal ultrasound.  Findings include  -A single intrauterine gestational sac is seen in the lower  uterus (center of gestational sac below the midpoint of the  uterus).  -Fetal pole with CRL measurement consistent with the  previously established date is seen.  Fetal heart rate was  109/min.  -Gestational sac is close to the previous cesarean scar but  not invading the niche. Sac appears triangular at the level of  scar.  -Myometrium is thinned out and is less than 3 mm at the level  of gestational sac.  -No bulging of the sac into the  bladder.  Myometrial bladder  interface appears normal.  -Color-flow shows increased vascularity at the implantation  site.  -An echogenic material consistent with blood clot is seen  close to the fundus (history of vaginal bleeding).  Impression: Cesarean scar pregnancy.  xxxxxxxxxxxxxxxxxxxxxxxxxxxxxxxxxxxxxxxxxxxxxxxxxx  Consultation (see EPIC )  I had the pleasure of seeing Ms. Haberkorn [facility] at [facility]. She was accompanied by her  husband. She is G2 P1 at 5w 6d gestation with suspected  cesarean scar pregnancy.  Patient has been having intermittent vaginal bleeding.  At  your office ultrasound, cesarean scar pregnancy was  suspected.  Obstetric history significant for a term cesarean delivery  (failure to progress in labor) of a female infant weighing 8  pounds 9 ounces at birth.  Pregnancy conceived after IVF.  Past medical history: Type 2 diabetes patient takes  metformin.  Recent hemoglobin level was 5.9%.  She does  not have hypertension or any other chronic medical  conditions.  Allergies: No known drug allergies.  Prenatal course: This is a natural conception.  Our concerns include:  Cesarean scar pregnancy  -Findings strongly raise the possibility of cesarean scar  pregnancy.  Ideal time to diagnose condition will be between  6- and 8-weeks' gestation when placenta is being formed.  -Differential diagnosis include intrauterine pregnancy (low   possibility).  -I discussed the natural course of cesarean scar pregnancy.  Complications include severe hemorrhage, placenta accreta  spectrum (PAS). Uterine rupture in later gestations have  been reported.  -Expectant management leads to hemorrhagic complications  in more than 50% of cases and can lead to hysterectomy.  -Most cases evolve into PAS. I discussed PAS and that it  usually leads to hysterectomy at delivery.  Management of cesarean scar pregnancy  -Vacuum aspiration (suction curettage) under ultrasound  guidance with laparoscopic/laparotomy preparations showed  good results with lower complication rate  -Intragestational injection of methotrexate (or less commonly,  potassium chloride) followed by vacuum aspiration has also  shown to be associated with fewer complications.  -Systemic methotrexate alone has shown higher complication  rates. However, if vacuum aspiration is the mainstay  management after methotrexate, it should be associated with  fewer complications.  Although the diagnosis will be clearer at 7 to 8 weeks'  gestation, she could experience increased vaginal bleeding in  the waiting period. Spontaneous loss of scar pregnancy is a  possibility (10%).  Recurrence of scar pregnancy is 15% to 20%.  I spoke to Dr. April Manson [facility]) who had  performed several procedures (systemic methotrexate and  vacuum aspiration) with good success.  If patient decides on definitive treatment, I recommend  referring to Dr. April Manson.  I discussed with Dr. Langston Masker. She called the patient who  informed her that she would like to wait for 2 weeks. She is  aware of the possibility of increased vaginal bleeding ---------------------------------------------------------------------- Recommendations  -Follow-up scan in 2 weeks if patient chooses expectant  management. ----------------------------------------------------------------------                  Noralee Space, MD Electronically Signed  Final Report   02/03/2021 05:57 pm ----------------------------------------------------------------------   Labs:  CBC: Recent Labs    03/01/21 1535 03/01/21 1834 03/02/21 0026 03/02/21 0721  WBC 3.7* 5.1 5.3 5.4  HGB 10.0* 10.5* 9.2* 8.5*  HCT 29.6* 31.4* 27.0* 25.2*  PLT 133* 153 141* 139*    COAGS: No results for input(s): INR, APTT in the last 8760 hours.  BMP: Recent Labs    02/20/21 0845 03/01/21 1700  NA 135 136  K 4.3 3.3*  CL 105 107  CO2 23 23  GLUCOSE 105* 100*  BUN 11 7  CALCIUM 9.0 8.6*  CREATININE 0.51 0.44  GFRNONAA >60 >60    LIVER FUNCTION TESTS: Recent Labs    02/20/21 0845 03/01/21 1700  BILITOT 0.8 0.6  AST 17 23  ALT 26 46*  ALKPHOS 39 36*  PROT 6.6 5.5*  ALBUMIN 3.7 3.1*    TUMOR MARKERS: No results for input(s): AFPTM, CEA, CA199, CHROMGRNA in the last 8760 hours.  Assessment and Plan:  Pelvic hematoma Seems to be enlarging Balloon tamponade in uterus Hg has decreased Will plan for pelvic arteriogram with possible embolization Risks and benefits of pelvic arteriogram with possible embolization were discussed with the patient including, but not limited to bleeding, infection, vascular injury or contrast induced renal failure.  This interventional procedure involves the use of X-rays and because of the nature of the planned procedure, it is possible that we will have prolonged use of X-ray fluoroscopy.  Potential radiation risks to you include (but are not limited to) the following: - A slightly elevated risk for cancer  several years later in life. This risk is typically less than 0.5% percent. This risk is low in comparison to the normal incidence of human cancer, which is 33% for women and 50% for men according to the [employer]. - Radiation induced injury can include skin redness, resembling a rash, tissue breakdown / ulcers and hair loss (which can be temporary or permanent).   The likelihood of either of  these occurring depends on the difficulty of the procedure and whether you are sensitive to radiation due to previous procedures, disease, or genetic conditions.   IF your procedure requires a prolonged use of radiation, you will be notified and given written instructions for further action.  It is your responsibility to monitor the irradiated area for the 2 weeks following the procedure and to notify your physician if you are concerned that you have suffered a radiation induced injury.    All of the patient's questions were answered, patient is agreeable to proceed.  Consent signed and in chart.     Thank you for this interesting consult.  I greatly enjoyed meeting Melinda Castillo and look forward to participating in their care.  A copy of this report was sent to the requesting provider on this date.  Electronically Signed: Robet Leu, PA-C 03/02/2021, 11:35 AM   I spent a total of 40 Minutes    in face to face in clinical consultation, greater than 50% of which was counseling/coordinating care for pelvic arteriogram with possible embolization

## 2021-03-02 NOTE — Sedation Documentation (Signed)
Two nurses transported patient to [phone] assessed right groin with nurse no hematoma and dressing intact.

## 2021-03-02 NOTE — Sedation Documentation (Signed)
Doctor notified patient developing pain left groin who is at bedside speaking to patient.

## 2021-03-03 ENCOUNTER — Encounter (HOSPITAL_COMMUNITY): Payer: Self-pay

## 2021-03-03 HISTORY — PX: IR ANGIOGRAM SELECTIVE EACH ADDITIONAL VESSEL: IMG667

## 2021-03-03 HISTORY — PX: IR ANGIOGRAM PELVIS SELECTIVE OR SUPRASELECTIVE: IMG661

## 2021-03-03 LAB — CBC
HCT: 24.2 % — ABNORMAL LOW (ref 36.0–46.0)
Hemoglobin: 8 g/dL — ABNORMAL LOW (ref 12.0–15.0)
MCH: 29.5 pg (ref 26.0–34.0)
MCHC: 33.1 g/dL (ref 30.0–36.0)
MCV: 89.3 fL (ref 80.0–100.0)
Platelets: 133 10*3/uL — ABNORMAL LOW (ref 150–400)
RBC: 2.71 MIL/uL — ABNORMAL LOW (ref 3.87–5.11)
RDW: 13.1 % (ref 11.5–15.5)
WBC: 4.8 10*3/uL (ref 4.0–10.5)
nRBC: 0 % (ref 0.0–0.2)

## 2021-03-03 LAB — HCG, QUANTITATIVE, PREGNANCY: hCG, Beta Chain, Quant, S: 7210 m[IU]/mL — ABNORMAL HIGH (ref <5)

## 2021-03-03 LAB — PROGESTERONE: Progesterone: 14.6 ng/mL

## 2021-03-03 LAB — SURGICAL PATHOLOGY

## 2021-03-03 NOTE — Progress Notes (Signed)
Referring Physician(s): Dr April Manson  Supervising Physician: Simonne Come  Patient Status:  [facility] - In-pt  Chief Complaint:  Status post dilation and curettage 03/01/2021 complicated by uterine hemorrhage, s/p bilateral uterine artery embolization with Gelfoam with Dr. Loreta Ave on 03/02/2021  Subjective:  Patient sitting in recliner eating breakfast, not in acute distress.  Family member at the bedside.  Patient states that she is doing ok.  Allergies: Patient has no known allergies.  Medications: Prior to Admission medications   Medication Sig Start Date End Date Taking? Authorizing Provider  Cetirizine HCl (ZYRTEC ALLERGY) 10 MG CAPS Take 10 mg by mouth daily as needed.   Yes [provider]  doxycycline (VIBRAMYCIN) 100 MG capsule Take 100 mg by mouth 2 (two) times daily. Start date: 02/23/21 02/23/21  Yes [provider]  metFORMIN (GLUCOPHAGE-XR) 500 MG 24 hr tablet TAKE 1 TABLET BY MOUTH EVERY MORNING AND AT BEDTIME Patient taking differently: Take 1,000 mg by mouth daily. 11/27/20 11/27/21 Yes Kuneff, Renee A, DO  BD DISP NEEDLES 22G X 1-1/2" MISC  09/19/20   [provider]  methotrexate 50 MG/2ML injection Bring to office for administration Patient not taking: No sig reported 02/22/21   [name], MD     Vital Signs: BP 109/74 (BP Location: Right Arm)   Pulse 91   Temp 98.2 F (36.8 C) (Oral)   Resp 20   Ht 5\' 10"  (1.778 m)   Wt 230 lb (104.3 kg)   LMP 12/20/2020   SpO2 98%   BMI 33.00 kg/m   Physical Exam Vitals reviewed.  Constitutional:      General: She is not in acute distress.    Appearance: Normal appearance. She is not ill-appearing.  HENT:     Head: Normocephalic and atraumatic.  Cardiovascular:     Comments: Bilateral DP 2+  Pulmonary:     Effort: Pulmonary effort is normal.  Abdominal:     General: Abdomen is flat.     Palpations: Abdomen is soft.  Skin:    General: Skin is warm and dry.     Coloration: Skin is  not pale.     Comments: Positive dressing on R CFA puncture site. Site is unremarkable with no erythema, edema, tenderness, bleeding or drainage. Minimal amount of old, dry blood noted on the dressing. Dressing otherwise clean, dry, and intact.    Neurological:     Mental Status: She is alert and oriented to person, place, and time.  Psychiatric:        Mood and Affect: Mood normal.        Behavior: Behavior normal.        Judgment: Judgment normal.    Imaging: 12/22/2020 PELVIS (TRANSABDOMINAL ONLY)  Result Date: 03/01/2021 CLINICAL DATA:  Recent D and C for scar ectopic, assess for hematoma EXAM: TRANSABDOMINAL ULTRASOUND OF PELVIS TECHNIQUE: Transabdominal ultrasound examination of the pelvis was performed including evaluation of the uterus, ovaries, adnexal regions, and pelvic cul-de-sac. COMPARISON:  Limited intraoperative ultrasound images 03/01/2021 FINDINGS: Uterus Measurements: 9.7 x 6.9 x 8.2 cm = volume: 290.9 mL. Limited assessment due to transabdominal technique and body habitus. By visual inspection, there appears to be increased distance between the Foley balloon and the posterior wall of the bladder on sagittal images as compared with the intraoperative images. Suspected hypoechoic collection superior to the Foley balloon measuring 5.3 x 5.3 x 7 cm. Endometrium Unable to measure.  Foley catheter is present within the uterus. Right ovary Not seen Left  ovary Not seen Other findings: There is fluid between the uterus and bladder in the low pelvis. There is a small amount of fluid adjacent to the liver. IMPRESSION: 1. Technically difficult study secondary to habitus and transabdominal technique 2. A Foley catheter is noted within the uterus. Suspect that there is increased distance between Foley balloon and posterior wall of bladder as compared with intraoperative views suggesting the presence of a hematoma. There may be additional hypoechoic collection/hematoma superior to the Foley balloon. There  is a small amount of fluid adjacent to the liver and within the pelvis between the bladder and uterus. Contrast-enhanced CT could be helpful to assess extent of suspected hematoma and to assess for any extravasation. These results will be called to the ordering clinician or representative by the Radiologist Assistant, and communication documented in the PACS or [employer]. Electronically Signed   By: Jasmine Pang M.D.   On: 03/01/2021 23:59   [place] Intraoperative  Result Date: 03/01/2021 CLINICAL DATA:  Ultrasound was provided for use by the ordering physician.  No provider Interpretation or professional fees incurred.    IR Angiogram Pelvis Selective Or Supraselective  INDICATION: 35 year old female with uterine hemorrhage, status post dilation and curettage, presents for angiogram and possible embolization   EXAM: ULTRASOUND-GUIDED ACCESS RIGHT COMMON FEMORAL ARTERY   PELVIC ANGIOGRAM   BILATERAL UTERINE ARTERY EMBOLIZATION   HEMOSTATIC DEVICE DEPLOYED AT THE RIGHT COMMON FEMORAL ARTERY   MEDICATIONS: None   ANESTHESIA/SEDATION: Moderate (conscious) sedation was employed during this procedure. A total of Versed 3.5 mg and Fentanyl 175 mcg was administered intravenously.   Moderate Sedation Time: 66 minutes. The patient's level of consciousness and vital signs were monitored continuously by radiology nursing throughout the procedure under my direct supervision.   CONTRAST:  100 cc   FLUOROSCOPY TIME:  Fluoroscopy Time: 19 minutes 15 seconds (688 mGy).   COMPLICATIONS: None   PROCEDURE: The procedure, risks, benefits, and alternatives were explained to the patient. Questions regarding the procedure were encouraged and answered. The patient understands and consents to the procedure.   The right groin was prepped with Betadine in a sterile fashion, and a sterile drape was applied covering the operative field. A sterile gown and sterile gloves were used for the procedure. Local anesthesia was provided with  1% Lidocaine.   Ultrasound survey of the right inguinal region was performed with images stored and sent to PACs.   The skin and subcutaneous tissue generously infiltrated with 1% lidocaine for local anesthesia, and a micropuncture needle was advanced under ultrasound guidance into the right common femoral artery. With excellent blood flow returned, micro wire was advanced. The needle was removed and a micropuncture set was advanced over the micro wire. The inner dilator in the stylet were removed, and an 035 Bentson wire was advanced into the abdominal aorta. The micro sheath was removed, a 5 [place] vascular sheath was placed into the common femoral artery. The dilator was removed and the sheath was flushed.   A 5 French Cobra catheter was advanced over the Bentson wire and used to select the left common iliac artery. The Cobra catheter was advanced into the internal iliac artery.   Angiogram of the left hypogastric artery was performed for identification of the left uterine artery. Once we identified the origin, a high-flow Renegade micro catheter was advanced to the Cobra catheter with a 0.014 Fathom wire. The micro catheter and micro wire combination were used to select the uterine artery.   Angiogram was  performed.   Once we confirmed position the micro catheter within the distal descending left uterine artery, embolization was performed. Embolization of the left uterine artery was performed Gel-Foam slurry. Embolization was performed to stasis.   The the micro wire/micro catheter were removed, and the Cobra catheter and Bentson wire were used to form a Waltman loop. The Waltman loop was then used to select the ipsilateral hypogastric artery.   Angiogram was performed to confirm position and identified the origin of the urine artery. Once we confirmed the origin, the same micro catheter and micro wire combination were used to select the uterine artery. The catheter was advanced to the distal descending right  uterine artery. Angiogram was performed to confirm position.   Embolization of the right uterine artery was performed with Gel-Foam slurry. Stasis was achieved.   The Waltman loop was reduced via the left common iliac artery with the assistance of the Bentson wire, and then the catheter and wire were removed.   Angiogram of the right common femoral artery was performed.   An Exoseal device was deployed for hemostasis.   The patient tolerated the procedure well and remained hemodynamically stable throughout.   No complications were encountered and no significant blood loss was encountered.   IMPRESSION: Status post ultrasound guided access right common femoral artery for pelvic angiogram and Gel-Foam embolization of the bilateral uterine arteries to stasis.   Signed,   Yvone Neu. Reyne Dumas, RPVI   [facility]   [facility]iology     Electronically Signed   By: Gilmer Mor D.O.   On: 03/02/2021 17:17  IR Angiogram Selective Each Additional Vessel  INDICATION: 35 year old female with uterine hemorrhage, status post dilation and curettage, presents for angiogram and possible embolization   EXAM: ULTRASOUND-GUIDED ACCESS RIGHT COMMON FEMORAL ARTERY   PELVIC ANGIOGRAM   BILATERAL UTERINE ARTERY EMBOLIZATION   HEMOSTATIC DEVICE DEPLOYED AT THE RIGHT COMMON FEMORAL ARTERY   MEDICATIONS: None   ANESTHESIA/SEDATION: Moderate (conscious) sedation was employed during this procedure. A total of Versed 3.5 mg and Fentanyl 175 mcg was administered intravenously.   Moderate Sedation Time: 66 minutes. The patient's level of consciousness and vital signs were monitored continuously by radiology nursing throughout the procedure under my direct supervision.   CONTRAST:  100 cc   FLUOROSCOPY TIME:  Fluoroscopy Time: 19 minutes 15 seconds (688 mGy).   COMPLICATIONS: None   PROCEDURE: The procedure, risks, benefits, and alternatives were explained to the patient. Questions regarding the  procedure were encouraged and answered. The patient understands and consents to the procedure.   The right groin was prepped with Betadine in a sterile fashion, and a sterile drape was applied covering the operative field. A sterile gown and sterile gloves were used for the procedure. Local anesthesia was provided with 1% Lidocaine.   Ultrasound survey of the right inguinal region was performed with images stored and sent to PACs.   The skin and subcutaneous tissue generously infiltrated with 1% lidocaine for local anesthesia, and a micropuncture needle was advanced under ultrasound guidance into the right common femoral artery. With excellent blood flow returned, micro wire was advanced. The needle was removed and a micropuncture set was advanced over the micro wire. The inner dilator in the stylet were removed, and an 035 Bentson wire was advanced into the abdominal aorta. The micro sheath was removed, a 5 [place] vascular sheath was placed into the common femoral artery. The dilator was removed and the sheath was flushed.  A 5 French Cobra catheter was advanced over the Bentson wire and used to select the left common iliac artery. The Cobra catheter was advanced into the internal iliac artery.   Angiogram of the left hypogastric artery was performed for identification of the left uterine artery. Once we identified the origin, a high-flow Renegade micro catheter was advanced to the Cobra catheter with a 0.014 Fathom wire. The micro catheter and micro wire combination were used to select the uterine artery.   Angiogram was performed.   Once we confirmed position the micro catheter within the distal descending left uterine artery, embolization was performed. Embolization of the left uterine artery was performed Gel-Foam slurry. Embolization was performed to stasis.   The the micro wire/micro catheter were removed, and the Cobra catheter and Bentson wire were used to form a Waltman loop. The Waltman loop was then  used to select the ipsilateral hypogastric artery.   Angiogram was performed to confirm position and identified the origin of the urine artery. Once we confirmed the origin, the same micro catheter and micro wire combination were used to select the uterine artery. The catheter was advanced to the distal descending right uterine artery. Angiogram was performed to confirm position.   Embolization of the right uterine artery was performed with Gel-Foam slurry. Stasis was achieved.   The Waltman loop was reduced via the left common iliac artery with the assistance of the Bentson wire, and then the catheter and wire were removed.   Angiogram of the right common femoral artery was performed.   An Exoseal device was deployed for hemostasis.   The patient tolerated the procedure well and remained hemodynamically stable throughout.   No complications were encountered and no significant blood loss was encountered.   IMPRESSION: Status post ultrasound guided access right common femoral artery for pelvic angiogram and Gel-Foam embolization of the bilateral uterine arteries to stasis.   Signed,   Yvone Neu. Reyne Dumas, RPVI   [facility]   [facility]iology     Electronically Signed   By: Gilmer Mor D.O.   On: 03/02/2021 17:17  IR Angiogram Selective Each Additional Vessel  INDICATION: 35 year old female with uterine hemorrhage, status post dilation and curettage, presents for angiogram and possible embolization   EXAM: ULTRASOUND-GUIDED ACCESS RIGHT COMMON FEMORAL ARTERY   PELVIC ANGIOGRAM   BILATERAL UTERINE ARTERY EMBOLIZATION   HEMOSTATIC DEVICE DEPLOYED AT THE RIGHT COMMON FEMORAL ARTERY   MEDICATIONS: None   ANESTHESIA/SEDATION: Moderate (conscious) sedation was employed during this procedure. A total of Versed 3.5 mg and Fentanyl 175 mcg was administered intravenously.   Moderate Sedation Time: 66 minutes. The patient's level of consciousness and vital signs were monitored  continuously by radiology nursing throughout the procedure under my direct supervision.   CONTRAST:  100 cc   FLUOROSCOPY TIME:  Fluoroscopy Time: 19 minutes 15 seconds (688 mGy).   COMPLICATIONS: None   PROCEDURE: The procedure, risks, benefits, and alternatives were explained to the patient. Questions regarding the procedure were encouraged and answered. The patient understands and consents to the procedure.   The right groin was prepped with Betadine in a sterile fashion, and a sterile drape was applied covering the operative field. A sterile gown and sterile gloves were used for the procedure. Local anesthesia was provided with 1% Lidocaine.   Ultrasound survey of the right inguinal region was performed with images stored and sent to PACs.   The skin and subcutaneous tissue generously infiltrated with 1% lidocaine  for local anesthesia, and a micropuncture needle was advanced under ultrasound guidance into the right common femoral artery. With excellent blood flow returned, micro wire was advanced. The needle was removed and a micropuncture set was advanced over the micro wire. The inner dilator in the stylet were removed, and an 035 Bentson wire was advanced into the abdominal aorta. The micro sheath was removed, a 5 [place] vascular sheath was placed into the common femoral artery. The dilator was removed and the sheath was flushed.   A 5 French Cobra catheter was advanced over the Bentson wire and used to select the left common iliac artery. The Cobra catheter was advanced into the internal iliac artery.   Angiogram of the left hypogastric artery was performed for identification of the left uterine artery. Once we identified the origin, a high-flow Renegade micro catheter was advanced to the Cobra catheter with a 0.014 Fathom wire. The micro catheter and micro wire combination were used to select the uterine artery.   Angiogram was performed.   Once we confirmed position the micro catheter within the distal  descending left uterine artery, embolization was performed. Embolization of the left uterine artery was performed Gel-Foam slurry. Embolization was performed to stasis.   The the micro wire/micro catheter were removed, and the Cobra catheter and Bentson wire were used to form a Waltman loop. The Waltman loop was then used to select the ipsilateral hypogastric artery.   Angiogram was performed to confirm position and identified the origin of the urine artery. Once we confirmed the origin, the same micro catheter and micro wire combination were used to select the uterine artery. The catheter was advanced to the distal descending right uterine artery. Angiogram was performed to confirm position.   Embolization of the right uterine artery was performed with Gel-Foam slurry. Stasis was achieved.   The Waltman loop was reduced via the left common iliac artery with the assistance of the Bentson wire, and then the catheter and wire were removed.   Angiogram of the right common femoral artery was performed.   An Exoseal device was deployed for hemostasis.   The patient tolerated the procedure well and remained hemodynamically stable throughout.   No complications were encountered and no significant blood loss was encountered.   IMPRESSION: Status post ultrasound guided access right common femoral artery for pelvic angiogram and Gel-Foam embolization of the bilateral uterine arteries to stasis.   Signed,   Yvone Neu. Reyne Dumas, RPVI   [facility]   [facility]iology     Electronically Signed   By: Gilmer Mor D.O.   On: 03/02/2021 17:17  IR US Guide Vasc Access Right  Result Date: 03/02/2021 INDICATION: 35 year old female with uterine hemorrhage, status post dilation and curettage, presents for angiogram and possible embolization EXAM: ULTRASOUND-GUIDED ACCESS RIGHT COMMON FEMORAL ARTERY PELVIC ANGIOGRAM BILATERAL UTERINE ARTERY EMBOLIZATION HEMOSTATIC DEVICE DEPLOYED AT THE RIGHT  COMMON FEMORAL ARTERY MEDICATIONS: None ANESTHESIA/SEDATION: Moderate (conscious) sedation was employed during this procedure. A total of Versed 3.5 mg and Fentanyl 175 mcg was administered intravenously. Moderate Sedation Time: 66 minutes. The patient's level of consciousness and vital signs were monitored continuously by radiology nursing throughout the procedure under my direct supervision. CONTRAST:  100 cc FLUOROSCOPY TIME:  Fluoroscopy Time: 19 minutes 15 seconds (688 mGy). COMPLICATIONS: None PROCEDURE: The procedure, risks, benefits, and alternatives were explained to the patient. Questions regarding the procedure were encouraged and answered. The patient understands and consents to the procedure. The right  groin was prepped with Betadine in a sterile fashion, and a sterile drape was applied covering the operative field. A sterile gown and sterile gloves were used for the procedure. Local anesthesia was provided with 1% Lidocaine. Ultrasound survey of the right inguinal region was performed with images stored and sent to PACs. The skin and subcutaneous tissue generously infiltrated with 1% lidocaine for local anesthesia, and a micropuncture needle was advanced under ultrasound guidance into the right common femoral artery. With excellent blood flow returned, micro wire was advanced. The needle was removed and a micropuncture set was advanced over the micro wire. The inner dilator in the stylet were removed, and an 035 Bentson wire was advanced into the abdominal aorta. The micro sheath was removed, a 5 [place] vascular sheath was placed into the common femoral artery. The dilator was removed and the sheath was flushed. A 5 French Cobra catheter was advanced over the Bentson wire and used to select the left common iliac artery. The Cobra catheter was advanced into the internal iliac artery. Angiogram of the left hypogastric artery was performed for identification of the left uterine artery. Once we identified  the origin, a high-flow Renegade micro catheter was advanced to the Cobra catheter with a 0.014 Fathom wire. The micro catheter and micro wire combination were used to select the uterine artery. Angiogram was performed. Once we confirmed position the micro catheter within the distal descending left uterine artery, embolization was performed. Embolization of the left uterine artery was performed Gel-Foam slurry. Embolization was performed to stasis. The the micro wire/micro catheter were removed, and the Cobra catheter and Bentson wire were used to form a Waltman loop. The Waltman loop was then used to select the ipsilateral hypogastric artery. Angiogram was performed to confirm position and identified the origin of the urine artery. Once we confirmed the origin, the same micro catheter and micro wire combination were used to select the uterine artery. The catheter was advanced to the distal descending right uterine artery. Angiogram was performed to confirm position. Embolization of the right uterine artery was performed with Gel-Foam slurry. Stasis was achieved. The Waltman loop was reduced via the left common iliac artery with the assistance of the Bentson wire, and then the catheter and wire were removed. Angiogram of the right common femoral artery was performed. An Exoseal device was deployed for hemostasis. The patient tolerated the procedure well and remained hemodynamically stable throughout. No complications were encountered and no significant blood loss was encountered. IMPRESSION: Status post ultrasound guided access right common femoral artery for pelvic angiogram and Gel-Foam embolization of the bilateral uterine arteries to stasis. Signed, Yvone Neu. Reyne Dumas, RPVI [facility] Electronically Signed   By: Gilmer Mor D.O.   On: 03/02/2021 17:17   IR EMBO ART  VEN HEMORR LYMPH EXTRAV  INC GUIDE ROADMAPPING  Result Date:  03/02/2021 INDICATION: 35 year old female with uterine hemorrhage, status post dilation and curettage, presents for angiogram and possible embolization EXAM: ULTRASOUND-GUIDED ACCESS RIGHT COMMON FEMORAL ARTERY PELVIC ANGIOGRAM BILATERAL UTERINE ARTERY EMBOLIZATION HEMOSTATIC DEVICE DEPLOYED AT THE RIGHT COMMON FEMORAL ARTERY MEDICATIONS: None ANESTHESIA/SEDATION: Moderate (conscious) sedation was employed during this procedure. A total of Versed 3.5 mg and Fentanyl 175 mcg was administered intravenously. Moderate Sedation Time: 66 minutes. The patient's level of consciousness and vital signs were monitored continuously by radiology nursing throughout the procedure under my direct supervision. CONTRAST:  100 cc FLUOROSCOPY TIME:  Fluoroscopy Time: 19 minutes 15 seconds (688 mGy).  COMPLICATIONS: None PROCEDURE: The procedure, risks, benefits, and alternatives were explained to the patient. Questions regarding the procedure were encouraged and answered. The patient understands and consents to the procedure. The right groin was prepped with Betadine in a sterile fashion, and a sterile drape was applied covering the operative field. A sterile gown and sterile gloves were used for the procedure. Local anesthesia was provided with 1% Lidocaine. Ultrasound survey of the right inguinal region was performed with images stored and sent to PACs. The skin and subcutaneous tissue generously infiltrated with 1% lidocaine for local anesthesia, and a micropuncture needle was advanced under ultrasound guidance into the right common femoral artery. With excellent blood flow returned, micro wire was advanced. The needle was removed and a micropuncture set was advanced over the micro wire. The inner dilator in the stylet were removed, and an 035 Bentson wire was advanced into the abdominal aorta. The micro sheath was removed, a 5 [place] vascular sheath was placed into the common femoral artery. The dilator was removed and the sheath was  flushed. A 5 French Cobra catheter was advanced over the Bentson wire and used to select the left common iliac artery. The Cobra catheter was advanced into the internal iliac artery. Angiogram of the left hypogastric artery was performed for identification of the left uterine artery. Once we identified the origin, a high-flow Renegade micro catheter was advanced to the Cobra catheter with a 0.014 Fathom wire. The micro catheter and micro wire combination were used to select the uterine artery. Angiogram was performed. Once we confirmed position the micro catheter within the distal descending left uterine artery, embolization was performed. Embolization of the left uterine artery was performed Gel-Foam slurry. Embolization was performed to stasis. The the micro wire/micro catheter were removed, and the Cobra catheter and Bentson wire were used to form a Waltman loop. The Waltman loop was then used to select the ipsilateral hypogastric artery. Angiogram was performed to confirm position and identified the origin of the urine artery. Once we confirmed the origin, the same micro catheter and micro wire combination were used to select the uterine artery. The catheter was advanced to the distal descending right uterine artery. Angiogram was performed to confirm position. Embolization of the right uterine artery was performed with Gel-Foam slurry. Stasis was achieved. The Waltman loop was reduced via the left common iliac artery with the assistance of the Bentson wire, and then the catheter and wire were removed. Angiogram of the right common femoral artery was performed. An Exoseal device was deployed for hemostasis. The patient tolerated the procedure well and remained hemodynamically stable throughout. No complications were encountered and no significant blood loss was encountered. IMPRESSION: Status post ultrasound guided access right common femoral artery for pelvic angiogram and Gel-Foam embolization of the bilateral  uterine arteries to stasis. Signed, Yvone Neu. Reyne Dumas, RPVI Vascular and Interventional Radiology Specialists [facility] Radiology Electronically Signed   By: Gilmer Mor D.O.   On: 03/02/2021 17:17   IR EMBO ART  VEN HEMORR LYMPH EXTRAV  INC GUIDE ROADMAPPING  Result Date: 03/02/2021 INDICATION: 35 year old female with uterine hemorrhage, status post dilation and curettage, presents for angiogram and possible embolization EXAM: ULTRASOUND-GUIDED ACCESS RIGHT COMMON FEMORAL ARTERY PELVIC ANGIOGRAM BILATERAL UTERINE ARTERY EMBOLIZATION HEMOSTATIC DEVICE DEPLOYED AT THE RIGHT COMMON FEMORAL ARTERY MEDICATIONS: None ANESTHESIA/SEDATION: Moderate (conscious) sedation was employed during this procedure. A total of Versed 3.5 mg and Fentanyl 175 mcg was administered intravenously. Moderate Sedation Time: 66 minutes. The patient's level of  consciousness and vital signs were monitored continuously by radiology nursing throughout the procedure under my direct supervision. CONTRAST:  100 cc FLUOROSCOPY TIME:  Fluoroscopy Time: 19 minutes 15 seconds (688 mGy). COMPLICATIONS: None PROCEDURE: The procedure, risks, benefits, and alternatives were explained to the patient. Questions regarding the procedure were encouraged and answered. The patient understands and consents to the procedure. The right groin was prepped with Betadine in a sterile fashion, and a sterile drape was applied covering the operative field. A sterile gown and sterile gloves were used for the procedure. Local anesthesia was provided with 1% Lidocaine. Ultrasound survey of the right inguinal region was performed with images stored and sent to PACs. The skin and subcutaneous tissue generously infiltrated with 1% lidocaine for local anesthesia, and a micropuncture needle was advanced under ultrasound guidance into the right common femoral artery. With excellent blood flow returned, micro wire was advanced. The needle was removed and a micropuncture set  was advanced over the micro wire. The inner dilator in the stylet were removed, and an 035 Bentson wire was advanced into the abdominal aorta. The micro sheath was removed, a 5 [place] vascular sheath was placed into the common femoral artery. The dilator was removed and the sheath was flushed. A 5 French Cobra catheter was advanced over the Bentson wire and used to select the left common iliac artery. The Cobra catheter was advanced into the internal iliac artery. Angiogram of the left hypogastric artery was performed for identification of the left uterine artery. Once we identified the origin, a high-flow Renegade micro catheter was advanced to the Cobra catheter with a 0.014 Fathom wire. The micro catheter and micro wire combination were used to select the uterine artery. Angiogram was performed. Once we confirmed position the micro catheter within the distal descending left uterine artery, embolization was performed. Embolization of the left uterine artery was performed Gel-Foam slurry. Embolization was performed to stasis. The the micro wire/micro catheter were removed, and the Cobra catheter and Bentson wire were used to form a Waltman loop. The Waltman loop was then used to select the ipsilateral hypogastric artery. Angiogram was performed to confirm position and identified the origin of the urine artery. Once we confirmed the origin, the same micro catheter and micro wire combination were used to select the uterine artery. The catheter was advanced to the distal descending right uterine artery. Angiogram was performed to confirm position. Embolization of the right uterine artery was performed with Gel-Foam slurry. Stasis was achieved. The Waltman loop was reduced via the left common iliac artery with the assistance of the Bentson wire, and then the catheter and wire were removed. Angiogram of the right common femoral artery was performed. An Exoseal device was deployed for hemostasis. The patient tolerated  the procedure well and remained hemodynamically stable throughout. No complications were encountered and no significant blood loss was encountered. IMPRESSION: Status post ultrasound guided access right common femoral artery for pelvic angiogram and Gel-Foam embolization of the bilateral uterine arteries to stasis. Signed, Yvone Neu. Reyne Dumas, RPVI Vascular and Interventional Radiology Specialists [facility] Radiology Electronically Signed   By: Gilmer Mor D.O.   On: 03/02/2021 17:17    Labs:  CBC: Recent Labs    03/01/21 1834 03/02/21 0026 03/02/21 0721 03/03/21 0100  WBC 5.1 5.3 5.4 4.8  HGB 10.5* 9.2* 8.5* 8.0*  HCT 31.4* 27.0* 25.2* 24.2*  PLT 153 141* 139* 133*    COAGS: No results for input(s): INR, APTT in the last 8760 hours.  BMP: Recent Labs    02/20/21 0845 03/01/21 1700  NA 135 136  K 4.3 3.3*  CL 105 107  CO2 23 23  GLUCOSE 105* 100*  BUN 11 7  CALCIUM 9.0 8.6*  CREATININE 0.51 0.44  GFRNONAA >60 >60    LIVER FUNCTION TESTS: Recent Labs    02/20/21 0845 03/01/21 1700  BILITOT 0.8 0.6  AST 17 23  ALT 26 46*  ALKPHOS 39 36*  PROT 6.6 5.5*  ALBUMIN 3.7 3.1*    Assessment and Plan:   35 yo female status post dilation and curettage 03/01/2021 complicated by uterine hemorrhage, s/p bilateral uterine artery embolization with Gelfoam with Dr. Loreta AveWagner on 03/02/2021  Patient hemodynamically stable VSS Hgb 8.0 today (8.5 yesterday) Right CFA puncture site stable, no signs or symptoms of acute infection or bleeding. DP 2+ bilaterally  Further treatment plan per OBGYN Appreciate and agree with the plan.  Please call IR for questions and concerns.   Electronically Signed: Willette BraceAimee H Aiya Keach, PA-C 03/03/2021, 9:03 AM   I spent a total of 25 Minutes at the the patient's bedside AND on the patient's hospital floor or unit, greater than 50% of which was counseling/coordinating care for bilateral uterine artery embolization with Gelfoam

## 2021-03-03 NOTE — Progress Notes (Signed)
PT being d/c, VSS, Iv removed, education complete  Kalman Jewels, RN 03/03/2021 4:28 PM

## 2021-03-03 NOTE — Progress Notes (Signed)
Wasted 50mL of hydromorphone with Delorise Jackson, Charity fundraiser.  Kalman Jewels, RN 03/03/2021 5:52 PM

## 2021-03-03 NOTE — Plan of Care (Signed)
  Problem: Clinical Measurements: Goal: Will remain free from infection Outcome: Progressing Goal: Diagnostic test results will improve Outcome: Progressing   

## 2021-03-03 NOTE — Progress Notes (Signed)
Subjective: Postop Day 2: Cesarean section scar ectopic curettage and BUAE Patient reports no symptoms  Objective: Vital signs in last 24 hours: Temp:  [98.1 F (36.7 C)-98.7 F (37.1 C)] 98.3 F (36.8 C) (09/07 1209) Pulse Rate:  [72-91] 89 (09/07 1209) Resp:  [12-25] 21 (09/07 1521) BP: (108-154)/(70-95) 108/70 (09/07 1209) SpO2:  [97 %-100 %] 99 % (09/07 1521) FiO2 (%):  [12 %] 12 % (09/07 0923)  Physical Exam:  General: alert, cooperative, and flushed Lochia: appropriate   Recent Labs    03/02/21 0721 03/03/21 0100  HGB 8.5* 8.0*  HCT 25.2* 24.2*    Assessment/Plan: Status post Cesarean section scar ectopic curettage and BUAE  50 cc balloon removed from C/S scar site with minimal bleeding (dark) Discharge home with precautions and return to clinic in 5 d. Recheck HCG (down to 7000 mIU/mL) and CBC in 2 d.   Melinda Castillo 03/03/2021, 3:45 PM

## 2021-03-03 NOTE — Progress Notes (Signed)
Patient called asking for this RN to come into the room. Patient noticed that she felt wet. Upon assessment, patient had scant amount of bright red blood coming from foley insertion site. Patient was cleaned and towel placed under area incase there was more drainage.   Patient was told to call this RN if the bleeding became worse/increased. Will continue to monitor

## 2021-03-04 ENCOUNTER — Encounter (HOSPITAL_COMMUNITY): Payer: Self-pay | Admitting: Obstetrics and Gynecology

## 2021-03-04 LAB — BPAM RBC
Blood Product Expiration Date: 202209112359
Blood Product Expiration Date: 202209122359
ISSUE DATE / TIME: 202209051744
Unit Type and Rh: 9500
Unit Type and Rh: 9500

## 2021-03-04 LAB — TYPE AND SCREEN
ABO/RH(D): O NEG
Antibody Screen: POSITIVE
Unit division: 0
Unit division: 0

## 2021-03-04 MED ORDER — HEMOSTATIC AGENTS (NO CHARGE) OPTIME
TOPICAL | Status: DC | PRN
  Administered 2021-03-04: 1 via TOPICAL

## 2021-03-04 MED ORDER — MICROFIBRILLAR COLL HEMOSTAT EX PADS: MEDICATED_PAD | CUTANEOUS | Status: DC | PRN

## 2021-03-05 ENCOUNTER — Encounter: Payer: Self-pay | Admitting: *Deleted

## 2021-03-05 ENCOUNTER — Other Ambulatory Visit: Payer: Self-pay | Admitting: *Deleted

## 2021-03-05 DIAGNOSIS — O009 Unspecified ectopic pregnancy without intrauterine pregnancy: Secondary | ICD-10-CM | POA: Diagnosis not present

## 2021-03-05 NOTE — Patient Outreach (Addendum)
[facility]) Care Management  03/05/2021  Melinda Castillo 01-May-1986 [id number]   Transition of care telephone call/Case Closure   Referral received:03/03/21 Initial outreach:03/05/21 Insurance: [facility]   Initial unsuccessful telephone call to patient's preferred number in order to complete transition of care assessment; no answer, left HIPAA compliant voicemail message requesting return call.   Objective: Per the electronic medical record, Melinda Castillo  was hospitalized at [facility] 9/5-03/03/21 for Dilatation and Curettage with ultrasound guidance, s/p bilateral uterine artery embolization on 03/02/21.  Comorbidities include: Diabetes type 2, hemoglobin A1c 5.9%. She  was discharged to home on 03/03/21 without the need for home health services or durable medical equipment per the discharge summary.   Plan: This RNCM will route unsuccessful outreach letter with [facility] Care Management pamphlet and 24 hour Nurse Advice Line Magnet to [employer] Care Management clinical pool to be mailed to patient's home address. This RNCM will attempt another outreach within 4 business days.   Return call from patient  Subjective: Explained purpose of call and completed transition of care assessment.  Patient states that she is doing okay , denies post-procedure problems, says procedure sites are unremarkable, states pain well managed, tolerating diet, denies bowel or bladder problems. .   Reviewed accessing the following [place] Benefits :  She does not have the hospital indemnity She  uses a [facility] outpatient pharmacy at [facility].  Patient denies need for additional educational or resource information .       Assessment:  Patient voices good understanding of all discharge instructions.  See transition of care flowsheet for assessment details.   Plan:  Reviewed hospital discharge diagnosis of    Dilatation and  Curettage  discharge treatment plan using hospital discharge instructions, assessing medication adherence, reviewing problems requiring provider notification, and discussing the importance of follow up with surgeon, primary care provider and/or specialists as directed.     Plan No ongoing care management needs identified so will close case to [facility] Care Management services. Thanked patient for their services to [facility].     Egbert Garibaldi, RN, BSN  [facility] Care Management,Care Management Coordinator  [phone]- Mobile [phone]- Toll Free Main Office

## 2021-03-10 ENCOUNTER — Ambulatory Visit: Payer: 59 | Admitting: *Deleted

## 2021-03-12 ENCOUNTER — Other Ambulatory Visit (HOSPITAL_COMMUNITY): Payer: Self-pay

## 2021-03-12 DIAGNOSIS — O008 Other ectopic pregnancy without intrauterine pregnancy: Secondary | ICD-10-CM | POA: Diagnosis not present

## 2021-03-12 MED ORDER — DOXYCYCLINE HYCLATE 100 MG PO CAPS
ORAL_CAPSULE | ORAL | 0 refills | Status: DC
  Filled 2021-03-12: qty 20, 10d supply, fill #0

## 2021-03-19 DIAGNOSIS — O080 Genital tract and pelvic infection following ectopic and molar pregnancy: Secondary | ICD-10-CM | POA: Diagnosis not present

## 2021-03-24 NOTE — Discharge Summary (Signed)
OB Discharge Summary     Patient Name: Melinda Castillo DOB: [birth year] MRN: [id number]  Date of admission: 03/01/2021  Date of discharge: 03/24/2021  Admitting diagnosis: S/P dilatation and curettage [Z98.890] Pregnancy in wall of uterus [O00.80] Pregnancy: [redacted]w[redacted]d     Secondary diagnosis:  Active Problems:   S/P dilatation and curettage   Pregnancy in wall of uterus  Additional problems: Anemia due to blood loss     Discharge diagnosis: C-section scar ectopic pregnancy status post curettage tamponade balloon placement, resolving                                                                                   Procedures: Curettage of the C-section scar ectopic pregnancy and placement of tamponade balloon; bilateral uterine artery embolization with Matrigel  Complications: None  Hospital course: Patient underwent ultrasound-guided curettage of the C-section scar ectopic pregnancy and a 60 cc saline filled balloon had to be placed into the C-section scar defect for tamponade purposes since brisk bleeding ensued.  Patient was followed overnight and CT scan of was obtained.  After consultation with interventional radiology consensus was reached at bilateral uterine artery embolization with Matrigel should be performed.  Procedure was carried out successfully.  The next day the balloon was deflated and patient was observed.  She was discharged upon ensuring that the implantation site bleeding did not restart.  Physical exam  Vitals:   03/03/21 0405 03/03/21 0733 03/03/21 1209 03/03/21 1521  BP: 114/81 109/74 108/70   Pulse: 81 91 89   Resp: 14 20 (!) 25 (!) 21  Temp: 98.3 F (36.8 C) 98.2 F (36.8 C) 98.3 F (36.8 C)   TempSrc: Oral Oral Oral   SpO2:  98% 99% 99%  Weight:      Height:       General: alert, cooperative, and no distress Lochia: appropriate  DVT Evaluation: No evidence of DVT seen on physical exam. Labs: Lab Results  Component Value Date   WBC 4.8 03/03/2021    HGB 8.0 (L) 03/03/2021   HCT 24.2 (L) 03/03/2021   MCV 89.3 03/03/2021   PLT 133 (L) 03/03/2021   CMP Latest Ref Rng & Units 03/01/2021  Glucose 70 - 99 mg/dL 659(D)  BUN 6 - 20 mg/dL 7  Creatinine 3.57 - 0.17 mg/dL 7.93  Sodium 903 - 009 mmol/L 136  Potassium 3.5 - 5.1 mmol/L 3.3(L)  Chloride 98 - 111 mmol/L 107  CO2 22 - 32 mmol/L 23  Calcium 8.9 - 10.3 mg/dL 2.3(R)  Total Protein 6.5 - 8.1 g/dL 0.0(T)  Total Bilirubin 0.3 - 1.2 mg/dL 0.6  Alkaline Phos 38 - 126 U/L 36(L)  AST 15 - 41 U/L 23  ALT 0 - 44 U/L 46(H)    Discharge instruction: per After Visit Summary and "Baby and Me Booklet".  After visit meds:  Allergies as of 03/03/2021   No Known Allergies      Medication List     STOP taking these medications    BD Disp Needles 22G X 1-1/2" Misc Generic drug: NEEDLE (DISP) 22 G   methotrexate 50 MG/2ML injection       TAKE these medications  doxycycline 100 MG capsule Commonly known as: VIBRAMYCIN Take 100 mg by mouth 2 (two) times daily. Start date: 02/23/21   metFORMIN 500 MG 24 hr tablet Commonly known as: GLUCOPHAGE-XR TAKE 1 TABLET BY MOUTH EVERY MORNING AND AT BEDTIME What changed:  how much to take how to take this when to take this   ZyrTEC Allergy 10 MG Caps Generic drug: Cetirizine HCl Take 10 mg by mouth daily as needed.        Diet: routine diet  Activity: Advance as tolerated. Pelvic rest for 6 weeks.   Outpatient follow up:2-3 days Follow up Appt: Future Appointments  Date Time Provider Department Center  05/03/2021  3:30 PM Kuneff, Renee A, DO [facility]   Follow up Visit:No follow-ups on file. Disposition: Home in stable condition   03/24/2021 [name], MD

## 2021-03-26 DIAGNOSIS — O009 Unspecified ectopic pregnancy without intrauterine pregnancy: Secondary | ICD-10-CM | POA: Diagnosis not present

## 2021-03-29 DIAGNOSIS — O008 Other ectopic pregnancy without intrauterine pregnancy: Secondary | ICD-10-CM | POA: Diagnosis not present

## 2021-03-29 DIAGNOSIS — Z319 Encounter for procreative management, unspecified: Secondary | ICD-10-CM | POA: Diagnosis not present

## 2021-04-12 DIAGNOSIS — N858 Other specified noninflammatory disorders of uterus: Secondary | ICD-10-CM | POA: Diagnosis not present

## 2021-04-19 ENCOUNTER — Encounter (HOSPITAL_COMMUNITY): Payer: Self-pay | Admitting: Obstetrics and Gynecology

## 2021-04-22 ENCOUNTER — Other Ambulatory Visit: Payer: Self-pay | Admitting: Obstetrics and Gynecology

## 2021-04-27 ENCOUNTER — Other Ambulatory Visit (HOSPITAL_COMMUNITY): Payer: Self-pay

## 2021-05-03 ENCOUNTER — Encounter: Payer: Self-pay | Admitting: Family Medicine

## 2021-05-03 ENCOUNTER — Other Ambulatory Visit (HOSPITAL_COMMUNITY): Payer: Self-pay

## 2021-05-03 ENCOUNTER — Other Ambulatory Visit: Payer: Self-pay

## 2021-05-03 ENCOUNTER — Ambulatory Visit: Payer: 59 | Admitting: Family Medicine

## 2021-05-03 VITALS — BP 123/90 | HR 71 | Temp 97.3°F | Ht 69.0 in | Wt 237.0 lb

## 2021-05-03 DIAGNOSIS — E669 Obesity, unspecified: Secondary | ICD-10-CM | POA: Diagnosis not present

## 2021-05-03 DIAGNOSIS — E1169 Type 2 diabetes mellitus with other specified complication: Secondary | ICD-10-CM

## 2021-05-03 LAB — POCT GLYCOSYLATED HEMOGLOBIN (HGB A1C)
HbA1c POC (<> result, manual entry): 5.7 % (ref 4.0–5.6)
HbA1c, POC (controlled diabetic range): 5.7 % (ref 0.0–7.0)
HbA1c, POC (prediabetic range): 5.7 % (ref 5.7–6.4)
Hemoglobin A1C: 5.7 % — AB (ref 4.0–5.6)

## 2021-05-03 MED ORDER — METFORMIN HCL ER 500 MG PO TB24
ORAL_TABLET | ORAL | 1 refills | Status: DC
Start: 2021-05-03 — End: 2021-11-02
  Filled 2021-05-03 – 2021-07-26 (×2): qty 180, 90d supply, fill #0
  Filled 2021-10-27: qty 180, 90d supply, fill #1

## 2021-05-03 NOTE — Progress Notes (Signed)
Patient ID: Melinda Castillo, female  DOB: [birth year], 35 y.o.   MRN: [id number] Patient Care Team    Relationship Specialty Notifications Start End  Ma Hillock, DO PCP - General Family Medicine  07/22/20   Haynes Dage, OD  Optometry  07/22/20   Linda Hedges, [place] Physician Obstetrics and Gynecology  07/22/20   Janie Morning, MD  Gastroenterology  07/22/20     Chief Complaint  Patient presents with   Diabetes    [facility]; pt is not fasting    Subjective: Melinda Castillo is a 35 y.o.  female present for [facility] Diabetes type 2: Pt reports compliance  with metformin 500 ER BID. She did have Insulin with pregnancy. Patient denies dizziness, hyperglycemic or hypoglycemic events. Patient denies numbness, tingling in the extremities or nonhealing wounds of feet.  She unfortunately underwent D&C/E secondary to implantation into uterine wall.    Depression screen [facility] 2/9 05/03/2021 07/22/2020  Decreased Interest 0 0  Down, Depressed, Hopeless 0 0  PHQ - 2 Score 0 0   No flowsheet data found.     Fall Risk  07/22/2020  Falls in the past year? 0  Number falls in past yr: 0  Injury with Fall? 0  Follow up Falls evaluation completed   Immunization History  Administered Date(s) Administered   HPV Quadrivalent 01/29/2009   Hepatitis B 03/08/2011   Influenza Split 03/28/2013   Influenza, Quadrivalent, Recombinant, Inj, Pf 03/28/2019   Influenza, Seasonal, Injecte, Preservative Fre 03/27/2018, 03/28/2019   Influenza-Unspecified 03/27/2020, 03/27/2021   MMR 05/14/1994   PFIZER(Purple Top)SARS-COV-2 Vaccination 06/29/2019, 07/20/2019, 03/26/2020   Pneumococcal Polysaccharide-23 07/22/2020   Rho (D) Immune Globulin 07/15/2019, 09/26/2019   Tdap 08/02/2019    No results found.  Past Medical History:  Diagnosis Date   Diabetes mellitus without complication ([place])    Elevated liver function tests 03/01/2019   Formatting of this note might be different from the  original. 02/28/2019: AST/ALT 59/60; repeat next visit. AST normal, ALT = 43 03/26/19   Essential hypertension 11/02/2016   Group beta Strep positive 09/08/2019   History of bilateral breast reduction surgery 01/06/2012   Infertility, 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 IVF pregnancy through Dr Rolin Barry Day 5 embryo transfer. 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 Normal.    DILATION AND CURETTAGE OF UTERUS N/A 03/01/2021   Procedure: DILATATION AND CURETTAGE WITH ULTRASOUND GUIDANCE;  Surgeon: Governor Specking, MD;  Location: [place];  Service: Gynecology;  Laterality: N/A;   IR ANGIOGRAM PELVIS SELECTIVE OR SUPRASELECTIVE  03/03/2021   IR ANGIOGRAM SELECTIVE EACH ADDITIONAL VESSEL  03/03/2021   IR ANGIOGRAM SELECTIVE EACH ADDITIONAL VESSEL  03/03/2021   IR EMBO ART  VEN HEMORR LYMPH EXTRAV  INC GUIDE ROADMAPPING  03/02/2021   IR EMBO ART  VEN HEMORR LYMPH EXTRAV  INC GUIDE ROADMAPPING  03/02/2021   IR US GUIDE VASC ACCESS RIGHT  03/02/2021   TONSILLECTOMY  2006   Family History  Problem Relation Age of Onset   Hypertension Mother    Hypertension Father    Ovarian cancer Maternal Grandmother    Early death Maternal Grandmother  Heart disease Maternal Grandfather    Brain cancer Paternal Grandfather    Stroke Paternal Grandmother    Colon cancer Neg Hx    Colon polyps Neg Hx    Social History   Social History Narrative   Marital status/children/pets: married, 1 child.    Education/employment: Statistician, works as Immunologist at [employer].    Safety:      -Wears a bicycle helmet riding a bike: Yes     -smoke alarm in the home:Yes     - wears seatbelt: Yes     - Feels safe in  their relationships: Yes    Allergies as of 05/03/2021   No Known Allergies      Medication List        Accurate as of May 03, 2021 11:59 PM. If you have any questions, ask your nurse or doctor.          STOP taking these medications    doxycycline 100 MG capsule Commonly known as: VIBRAMYCIN Stopped by: Howard Pouch, DO   estradiol 2 MG tablet Commonly known as: ESTRACE Stopped by: Howard Pouch, DO   progesterone 50 MG/ML injection Stopped by: Howard Pouch, DO       TAKE these medications    metFORMIN 500 MG 24 hr tablet Commonly known as: GLUCOPHAGE-XR TAKE 1 TABLET BY MOUTH EVERY MORNING AND AT BEDTIME What changed:  how much to take how to take this when to take this   ZyrTEC Allergy 10 MG Caps Generic drug: Cetirizine HCl Take 10 mg by mouth daily as needed.        All past medical history, surgical history, allergies, family history, immunizations andmedications were updated in the EMR today and reviewed under the history and medication portions of their EMR.      Patient was never admitted.   ROS: 14 pt review of systems performed and negative (unless mentioned in an HPI)  Objective: BP 123/90   Pulse 71   Temp (!) 97.3 F (36.3 C) (Oral)   Ht _0  (1.753 m)   Wt 237 lb (107.5 kg)   LMP 12/20/2020 (Exact Date)   SpO2 100%   Breastfeeding Unknown   BMI 35.00 kg/m  Gen: Afebrile. No acute distress. Nontoxic, pleasant female.  HENT: AT. [place]. No cough.  Eyes:Pupils Equal Round Reactive to light, Extraocular movements intact,  Conjunctiva without redness, discharge or icterus. Neck/lymp/endocrine: Supple,no lymphadenopathy, no thyromegaly CV: RRR no murmur, no edema Chest: CTAB, no wheeze or crackles Skin: no rashes, purpura or petechiae.  Neuro:  Normal gait. PERLA. EOMi. Alert. Oriented x3 Psych: Normal affect, dress and demeanor. Normal speech. Normal thought content and judgment.   Assessment/plan: Melinda Castillo is a 35  y.o. female present for cmc Diabetes mellitus type 2 in obese ([place]) Stable.  - continue metformin to 500 mg BID - diabetic diet - continue  routine exercise.  PNA series: PNA23 UTD Flu shot: UTD 2022 (recommneded yearly) Microalb: UTD Foot exam: 07/22/2020 Eye exam: 05/2019, Dr. Domingo Cocking- has appt next week.  A1c: 7.5 in October 2021> 6.7>5.9> 5.7 F/u 5.5 mos. With fasting labs.     Orders Placed This Encounter  Procedures   POCT HgB A1C    Meds ordered this encounter  Medications   metFORMIN (GLUCOPHAGE-XR) 500 MG 24 hr tablet    Sig: TAKE 1 TABLET BY MOUTH EVERY MORNING AND AT BEDTIME    Dispense:  180 tablet    Refill:  1    Referral  Orders  No referral(s) requested today      Note is dictated utilizing voice recognition software. Although note has been proof read prior to signing, occasional typographical errors still can be missed. If any questions arise, please do not hesitate to call for verification.  Electronically signed by: Howard Pouch, DO [place]

## 2021-05-03 NOTE — Patient Instructions (Signed)
°  Great to see you today.  °I have refilled the medication(s) we provide.  ° °If labs were collected, we will inform you of lab results once received either by echart message or telephone call.  ° - echart message- for normal results that have been seen by the patient already.  ° - telephone call: abnormal results or if patient has not viewed results in their echart. ° ° °Next appt 5.5 mos. °

## 2021-05-06 DIAGNOSIS — Z8759 Personal history of other complications of pregnancy, childbirth and the puerperium: Secondary | ICD-10-CM | POA: Diagnosis not present

## 2021-05-06 DIAGNOSIS — N939 Abnormal uterine and vaginal bleeding, unspecified: Secondary | ICD-10-CM | POA: Diagnosis not present

## 2021-05-10 LAB — HM DIABETES EYE EXAM

## 2021-06-09 DIAGNOSIS — N858 Other specified noninflammatory disorders of uterus: Secondary | ICD-10-CM | POA: Diagnosis not present

## 2021-06-10 ENCOUNTER — Encounter (HOSPITAL_BASED_OUTPATIENT_CLINIC_OR_DEPARTMENT_OTHER): Payer: Self-pay | Admitting: Obstetrics and Gynecology

## 2021-06-10 ENCOUNTER — Other Ambulatory Visit: Payer: Self-pay

## 2021-06-10 NOTE — Progress Notes (Signed)
Spoke w/ via phone for pre-op interview--- pt Lab needs dos----  istat , ekg, urine preg             Lab results------ n/a COVID test -----patient states asymptomatic no test needed Arrive at ------- 1115 on 06-16-2021 NPO after MN NO Solid Food.  Clear liquids from MN until--- 1015 Med rec completed Medications to take morning of surgery ----- none Diabetic medication ----- do not take metformin morning of surgery Patient instructed no nail polish to be worn day of surgery Patient instructed to bring photo id and insurance card day of surgery Patient aware to have Driver (ride ) / caregiver for 24 hours after surgery --husband, Ambulance person Patient Special Instructions ----- n/a Pre-Op special Istructions ----- n/a Patient verbalized understanding of instructions that were given at this phone interview. Patient denies shortness of breath, chest pain, fever, cough at this phone interview.

## 2021-06-11 NOTE — H&P (Signed)
CC: Pre-op  HPI: 35 year old  scheduled for a L/S robotic assisted repair of C/S scar defect and possible suction D&C at [facility] on June 16, 2021. LMP 05/16/21  PMH: ectopic pregnancy, diabetes  PSH:  D&C 02/2021; tonsillectomy, wisdom teeth extraction, breast reduction  SOCIAL HX: Denies tobacco, alcohol, or drug use.  FAMILY HX: Noncontributory     MEDICATIONS: Metformin, PNV      ALLERGIES: NKDA  PHYSICAL EXAM: Constitutional: Well-developed, well-nourished female in no acute distress. Neurological: Alert and oriented to person, place, and time. Psychiatric: Mood and affect appropriate. Skin: No rashes or lesions. Respiratory: Good air movement with normal work of breathing. Cardiovascular: Regular rate and rhythm. Extremities grossly normal Gastrointestinal: Soft, nontender, nondistended. Appropriate bowel sounds. No masses or hernias appreciated. No hepatosplenomegaly.   ASSESSMENT: 35 year old  scheduled for a L/S robotic assisted repair of C/S scar defect and possible suction D&C at [facility] on June 16, 2021     MANAGEMENT/COUNSULING: * Pre-op counseling:  Discussed risks, benefits, and alternatives to the procedure.  Discussed risk of injury to surrounding organs, bleeding, infection, and creation of scar tissue.   Patient accepts risks and a consent will be signed on the day of surgery. Reviewed Bowel prep instructions and handout given. The visit was 15 min long with > half spent FTF counseling.

## 2021-06-15 NOTE — Anesthesia Preprocedure Evaluation (Addendum)
Anesthesia Evaluation  Patient identified by MRN, date of birth, ID band Patient awake    Reviewed: Allergy & Precautions, NPO status , Patient's Chart, lab work & pertinent test results  Airway Mallampati: II  TM Distance: >3 FB Neck ROM: Full    Dental no notable dental hx. (+) Dental Advisory Given, Teeth Intact   Pulmonary neg pulmonary ROS,    Pulmonary exam normal breath sounds clear to auscultation       Cardiovascular Exercise Tolerance: Good Normal cardiovascular exam Rhythm:Regular Rate:Normal     Neuro/Psych negative neurological ROS  negative psych ROS   GI/Hepatic negative GI ROS, Neg liver ROS,   Endo/Other  diabetes, Type 2, Oral Hypoglycemic Agents  Renal/GU      Musculoskeletal negative musculoskeletal ROS (+)   Abdominal (+) + obese (BMI 33.23),   Peds  Hematology Lab Results      Component                Value               Date                      WBC                      4.8                 03/03/2021                HGB                      16.0 (H)            06/16/2021                HCT                      47.0 (H)            06/16/2021                MCV                      89.3                03/03/2021                PLT                      133 (L)             03/03/2021              Anesthesia Other Findings   Reproductive/Obstetrics negative OB ROS                            Anesthesia Physical Anesthesia Plan  ASA: 2  Anesthesia Plan: General   Post-op Pain Management: Dilaudid IV, Lidocaine infusion, Toradol IV (intra-op) and Tylenol PO (pre-op)   Induction: Intravenous  PONV Risk Score and Plan: 4 or greater and Treatment may vary due to age or medical condition, Midazolam, Dexamethasone and Ondansetron  Airway Management Planned: Oral ETT  Additional Equipment: None  Intra-op Plan:   Post-operative Plan: Extubation in OR  Informed  Consent: I have reviewed the patients History and Physical, chart, labs and discussed the procedure including the risks, benefits and  alternatives for the proposed anesthesia with the patient or authorized representative who has indicated his/her understanding and acceptance.     Dental advisory given  Plan Discussed with: CRNA and Anesthesiologist  Anesthesia Plan Comments: (GA plus lidocaine infusion)       Anesthesia Quick Evaluation

## 2021-06-16 ENCOUNTER — Encounter (HOSPITAL_BASED_OUTPATIENT_CLINIC_OR_DEPARTMENT_OTHER): Payer: Self-pay | Admitting: Obstetrics and Gynecology

## 2021-06-16 ENCOUNTER — Ambulatory Visit (HOSPITAL_BASED_OUTPATIENT_CLINIC_OR_DEPARTMENT_OTHER): Payer: 59 | Admitting: Anesthesiology

## 2021-06-16 ENCOUNTER — Other Ambulatory Visit: Payer: Self-pay

## 2021-06-16 ENCOUNTER — Other Ambulatory Visit (HOSPITAL_COMMUNITY): Payer: Self-pay

## 2021-06-16 ENCOUNTER — Ambulatory Visit (HOSPITAL_BASED_OUTPATIENT_CLINIC_OR_DEPARTMENT_OTHER)
Admission: RE | Admit: 2021-06-16 | Discharge: 2021-06-16 | Disposition: A | Discharge status: Home / Self Care | Payer: 59 | Attending: Obstetrics and Gynecology | Admitting: Obstetrics and Gynecology

## 2021-06-16 ENCOUNTER — Encounter (HOSPITAL_BASED_OUTPATIENT_CLINIC_OR_DEPARTMENT_OTHER)
Admission: RE | Disposition: A | Discharge status: Home / Self Care | Payer: Self-pay | Source: Home / Self Care | Attending: Obstetrics and Gynecology

## 2021-06-16 DIAGNOSIS — E119 Type 2 diabetes mellitus without complications: Secondary | ICD-10-CM | POA: Diagnosis not present

## 2021-06-16 DIAGNOSIS — N85A Isthmocele: Secondary | ICD-10-CM | POA: Insufficient documentation

## 2021-06-16 DIAGNOSIS — N7013 Chronic salpingitis and oophoritis: Secondary | ICD-10-CM | POA: Diagnosis not present

## 2021-06-16 DIAGNOSIS — N7003 Acute salpingitis and oophoritis: Secondary | ICD-10-CM | POA: Diagnosis not present

## 2021-06-16 DIAGNOSIS — N736 Female pelvic peritoneal adhesions (postinfective): Secondary | ICD-10-CM | POA: Diagnosis not present

## 2021-06-16 DIAGNOSIS — N858 Other specified noninflammatory disorders of uterus: Secondary | ICD-10-CM | POA: Diagnosis not present

## 2021-06-16 DIAGNOSIS — R102 Pelvic and perineal pain: Secondary | ICD-10-CM | POA: Diagnosis not present

## 2021-06-16 HISTORY — DX: Type 2 diabetes mellitus without complications: E11.9

## 2021-06-16 HISTORY — PX: ROBOTIC ASSISTED LAPAROSCOPIC LYSIS OF ADHESION: SHX6080

## 2021-06-16 HISTORY — DX: Personal history of other complications of pregnancy, childbirth and the puerperium: Z87.59

## 2021-06-16 HISTORY — DX: Presence of spectacles and contact lenses: Z97.3

## 2021-06-16 HISTORY — DX: Other specified conditions associated with female genital organs and menstrual cycle: N94.89

## 2021-06-16 LAB — TYPE AND SCREEN
ABO/RH(D): O NEG
Antibody Screen: NEGATIVE

## 2021-06-16 LAB — POCT I-STAT, CHEM 8
BUN: 11 mg/dL (ref 6–20)
Calcium, Ion: 1.16 mmol/L (ref 1.15–1.40)
Chloride: 101 mmol/L (ref 98–111)
Creatinine, Ser: 0.6 mg/dL (ref 0.44–1.00)
Glucose, Bld: 118 mg/dL — ABNORMAL HIGH (ref 70–99)
HCT: 47 % — ABNORMAL HIGH (ref 36.0–46.0)
Hemoglobin: 16 g/dL — ABNORMAL HIGH (ref 12.0–15.0)
Potassium: 4.1 mmol/L (ref 3.5–5.1)
Sodium: 141 mmol/L (ref 135–145)
TCO2: 27 mmol/L (ref 22–32)

## 2021-06-16 LAB — POCT PREGNANCY, URINE: Preg Test, Ur: NEGATIVE

## 2021-06-16 SURGERY — LYSIS, ADHESIONS, ROBOT-ASSISTED, LAPAROSCOPIC
Anesthesia: General

## 2021-06-16 MED ORDER — KETOROLAC TROMETHAMINE 30 MG/ML IJ SOLN: 30.0000 mg | Freq: Once | INTRAMUSCULAR | Status: DC | PRN

## 2021-06-16 MED ORDER — LIDOCAINE 2% (20 MG/ML) 5 ML SYRINGE
INTRAMUSCULAR | Status: DC | PRN
  Administered 2021-06-16: 100 mg via INTRAVENOUS

## 2021-06-16 MED ORDER — POVIDONE-IODINE 10 % EX SWAB: 2.0000 "application " | Freq: Once | CUTANEOUS | Status: DC

## 2021-06-16 MED ORDER — HYDROMORPHONE HCL 1 MG/ML IJ SOLN
INTRAMUSCULAR | Status: DC | PRN
  Administered 2021-06-16 (×2): 1 mg via INTRAVENOUS

## 2021-06-16 MED ORDER — ONDANSETRON HCL 4 MG/2ML IJ SOLN: 4.0000 mg | Freq: Once | INTRAMUSCULAR | Status: DC | PRN

## 2021-06-16 MED ORDER — KETOROLAC TROMETHAMINE 30 MG/ML IJ SOLN
INTRAMUSCULAR | Status: AC
  Filled 2021-06-16: qty 1

## 2021-06-16 MED ORDER — WHITE PETROLATUM EX OINT
TOPICAL_OINTMENT | CUTANEOUS | Status: AC
  Filled 2021-06-16: qty 5

## 2021-06-16 MED ORDER — LIDOCAINE 2% (20 MG/ML) 5 ML SYRINGE
INTRAMUSCULAR | Status: AC
  Filled 2021-06-16: qty 5

## 2021-06-16 MED ORDER — HYDROMORPHONE HCL 1 MG/ML IJ SOLN
INTRAMUSCULAR | Status: AC
  Filled 2021-06-16: qty 1

## 2021-06-16 MED ORDER — PROPOFOL 10 MG/ML IV BOLUS
INTRAVENOUS | Status: AC
  Filled 2021-06-16: qty 20

## 2021-06-16 MED ORDER — HYDROMORPHONE HCL 2 MG/ML IJ SOLN
INTRAMUSCULAR | Status: AC
  Filled 2021-06-16: qty 1

## 2021-06-16 MED ORDER — LIDOCAINE 2% (20 MG/ML) 5 ML SYRINGE
INTRAMUSCULAR | Status: DC | PRN
  Administered 2021-06-16: 14:00:00 1.5 mg/kg/h via INTRAVENOUS

## 2021-06-16 MED ORDER — DEXAMETHASONE SODIUM PHOSPHATE 10 MG/ML IJ SOLN
INTRAMUSCULAR | Status: DC | PRN
  Administered 2021-06-16 (×2): 5 mg via INTRAVENOUS

## 2021-06-16 MED ORDER — OXYCODONE HCL 5 MG/5ML PO SOLN: 5.0000 mg | Freq: Once | ORAL | Status: DC | PRN

## 2021-06-16 MED ORDER — MIDAZOLAM HCL 2 MG/2ML IJ SOLN
INTRAMUSCULAR | Status: AC
  Filled 2021-06-16: qty 2

## 2021-06-16 MED ORDER — FENTANYL CITRATE (PF) 100 MCG/2ML IJ SOLN
INTRAMUSCULAR | Status: AC
  Filled 2021-06-16: qty 2

## 2021-06-16 MED ORDER — OXYCODONE HCL 5 MG PO TABS
ORAL_TABLET | ORAL | Status: AC
  Filled 2021-06-16: qty 1

## 2021-06-16 MED ORDER — ONDANSETRON HCL 4 MG/2ML IJ SOLN
INTRAMUSCULAR | Status: AC
  Filled 2021-06-16: qty 2

## 2021-06-16 MED ORDER — SODIUM CHLORIDE 0.9 % IR SOLN
Status: DC | PRN
  Administered 2021-06-16: 1000 mL

## 2021-06-16 MED ORDER — ONDANSETRON HCL 4 MG PO TABS
4.0000 mg | ORAL_TABLET | Freq: Every day | ORAL | 1 refills | Status: DC | PRN
  Filled 2021-06-16: qty 15, 15d supply, fill #0
  Filled 2021-10-27: qty 15, 15d supply, fill #1

## 2021-06-16 MED ORDER — PROPOFOL 10 MG/ML IV BOLUS
INTRAVENOUS | Status: DC | PRN
  Administered 2021-06-16: 120 mg via INTRAVENOUS

## 2021-06-16 MED ORDER — ROCURONIUM BROMIDE 10 MG/ML (PF) SYRINGE
PREFILLED_SYRINGE | INTRAVENOUS | Status: DC | PRN
  Administered 2021-06-16: 10 mg via INTRAVENOUS
  Administered 2021-06-16: 70 mg via INTRAVENOUS
  Administered 2021-06-16: 10 mg via INTRAVENOUS

## 2021-06-16 MED ORDER — ONDANSETRON HCL 4 MG/2ML IJ SOLN
INTRAMUSCULAR | Status: DC | PRN
  Administered 2021-06-16: 4 mg via INTRAVENOUS

## 2021-06-16 MED ORDER — CEFAZOLIN SODIUM 1 G IJ SOLR
INTRAMUSCULAR | Status: AC
  Filled 2021-06-16: qty 20

## 2021-06-16 MED ORDER — OXYCODONE HCL 5 MG PO TABS: 5.0000 mg | ORAL_TABLET | Freq: Once | ORAL | Status: DC | PRN

## 2021-06-16 MED ORDER — DEXMEDETOMIDINE (PRECEDEX) IN NS 20 MCG/5ML (4 MCG/ML) IV SYRINGE
PREFILLED_SYRINGE | INTRAVENOUS | Status: AC
  Filled 2021-06-16: qty 5

## 2021-06-16 MED ORDER — MIDAZOLAM HCL 2 MG/2ML IJ SOLN
INTRAMUSCULAR | Status: DC | PRN
  Administered 2021-06-16: 2 mg via INTRAVENOUS

## 2021-06-16 MED ORDER — KETOROLAC TROMETHAMINE 30 MG/ML IJ SOLN
INTRAMUSCULAR | Status: DC | PRN
  Administered 2021-06-16: 30 mg via INTRAVENOUS

## 2021-06-16 MED ORDER — LIDOCAINE 2% (20 MG/ML) 5 ML SYRINGE
INTRAMUSCULAR | Status: AC
  Filled 2021-06-16: qty 10

## 2021-06-16 MED ORDER — SUGAMMADEX SODIUM 200 MG/2ML IV SOLN
INTRAVENOUS | Status: DC | PRN
  Administered 2021-06-16: 200 mg via INTRAVENOUS

## 2021-06-16 MED ORDER — ROCURONIUM BROMIDE 10 MG/ML (PF) SYRINGE
PREFILLED_SYRINGE | INTRAVENOUS | Status: AC
  Filled 2021-06-16: qty 10

## 2021-06-16 MED ORDER — LACTATED RINGERS IV SOLN: INTRAVENOUS | Status: DC

## 2021-06-16 MED ORDER — DEXAMETHASONE SODIUM PHOSPHATE 10 MG/ML IJ SOLN
INTRAMUSCULAR | Status: AC
  Filled 2021-06-16: qty 1

## 2021-06-16 MED ORDER — HYDROMORPHONE HCL 1 MG/ML IJ SOLN
0.2500 mg | INTRAMUSCULAR | Status: DC | PRN
  Administered 2021-06-16 (×2): 0.25 mg via INTRAVENOUS
  Administered 2021-06-16: 16:00:00 0.5 mg via INTRAVENOUS

## 2021-06-16 MED ORDER — VASOPRESSIN 20 UNIT/ML IV SOLN
INTRAVENOUS | Status: DC | PRN
  Administered 2021-06-16: 16:00:00 30 mL via INTRAMUSCULAR

## 2021-06-16 MED ORDER — DEXMEDETOMIDINE (PRECEDEX) IN NS 20 MCG/5ML (4 MCG/ML) IV SYRINGE
PREFILLED_SYRINGE | INTRAVENOUS | Status: DC | PRN
  Administered 2021-06-16 (×2): 10 ug via INTRAVENOUS

## 2021-06-16 MED ORDER — FENTANYL CITRATE (PF) 100 MCG/2ML IJ SOLN
INTRAMUSCULAR | Status: DC | PRN
  Administered 2021-06-16 (×4): 50 ug via INTRAVENOUS

## 2021-06-16 MED ORDER — BUPIVACAINE-EPINEPHRINE 0.25% -1:200000 IJ SOLN
INTRAMUSCULAR | Status: DC | PRN
  Administered 2021-06-16: 10 mL

## 2021-06-16 MED ORDER — CEFAZOLIN SODIUM-DEXTROSE 2-3 GM-%(50ML) IV SOLR
INTRAVENOUS | Status: DC | PRN
  Administered 2021-06-16: 2 g via INTRAVENOUS

## 2021-06-16 MED ORDER — OXYCODONE-ACETAMINOPHEN 7.5-325 MG PO TABS: 1.0000 | ORAL_TABLET | ORAL | 0 refills | Status: DC | PRN

## 2021-06-16 SURGICAL SUPPLY — 82 items
ADH SKN CLS APL DERMABOND .7 (GAUZE/BANDAGES/DRESSINGS) ×1
BARRIER ADHS 3X4 INTERCEED (GAUZE/BANDAGES/DRESSINGS) ×2 IMPLANT
BIPOLAR CUTTING LOOP 21FR (ELECTRODE)
BRR ADH 4X3 ABS CNTRL BYND (GAUZE/BANDAGES/DRESSINGS) ×1
BRR ADH 6X5 SEPRAFILM 1 SHT (MISCELLANEOUS)
CANNULA CURETTE W/SYR 6 (CANNULA) IMPLANT
CANNULA CURETTE W/SYR 6MM (CANNULA)
CANNULA CURETTE W/SYR 7 (CANNULA) IMPLANT
CANNULA CURETTE W/SYR 7MM (CANNULA)
CATH FOLEY 3WAY  5CC 16FR (CATHETERS)
CATH FOLEY 3WAY 5CC 16FR (CATHETERS) ×1 IMPLANT
CATH NOVY CORNUAL CURVED 5.0 (CATHETERS) IMPLANT
CATH ROBINSON RED A/P 16FR (CATHETERS) ×1 IMPLANT
CNTNR URN SCR LID CUP LEK RST (MISCELLANEOUS) IMPLANT
CONT SPEC 4OZ STRL OR WHT (MISCELLANEOUS) ×3
COVER BACK TABLE 60X90IN (DRAPES) ×4 IMPLANT
COVER TIP SHEARS 8 DVNC (MISCELLANEOUS) ×1 IMPLANT
COVER TIP SHEARS 8MM DA VINCI (MISCELLANEOUS) ×2
DECANTER SPIKE VIAL GLASS SM (MISCELLANEOUS) ×2 IMPLANT
DEFOGGER SCOPE WARMER CLEARIFY (MISCELLANEOUS) ×3 IMPLANT
DERMABOND ADVANCED (GAUZE/BANDAGES/DRESSINGS) ×2
DERMABOND ADVANCED .7 DNX12 (GAUZE/BANDAGES/DRESSINGS) ×1 IMPLANT
DILATOR CANAL MILEX (MISCELLANEOUS) IMPLANT
DRAPE ARM DVNC X/XI (DISPOSABLE) ×3 IMPLANT
DRAPE C-ARM 42X120 X-RAY (DRAPES) IMPLANT
DRAPE COLUMN DVNC XI (DISPOSABLE) ×1 IMPLANT
DRAPE DA VINCI XI ARM (DISPOSABLE) ×8
DRAPE DA VINCI XI COLUMN (DISPOSABLE) ×2
DRAPE UTILITY XL STRL (DRAPES) ×2 IMPLANT
DRSG OPSITE POSTOP 3X4 (GAUZE/BANDAGES/DRESSINGS) ×1 IMPLANT
DURAPREP 26ML APPLICATOR (WOUND CARE) ×3 IMPLANT
ELECT BIPOLAR KNIFE NDL PTD 7M (ELECTRODE) IMPLANT
ELECT REM PT RETURN 9FT ADLT (ELECTROSURGICAL) ×3
ELECTRODE REM PT RTRN 9FT ADLT (ELECTROSURGICAL) ×1 IMPLANT
GAUZE 4X4 16PLY ~~LOC~~+RFID DBL (SPONGE) ×6 IMPLANT
GLOVE SURG ENC MOIS LTX SZ8 (GLOVE) ×9 IMPLANT
GLOVE SURG UNDER POLY LF SZ7 (GLOVE) ×4 IMPLANT
GLOVE SURG UNDER POLY LF SZ8.5 (GLOVE) ×5 IMPLANT
GOWN STRL REUS W/TWL XL LVL3 (GOWN DISPOSABLE) ×6 IMPLANT
IRRIG SUCT STRYKERFLOW 2 WTIP (MISCELLANEOUS) ×3
IRRIGATION SUCT STRKRFLW 2 WTP (MISCELLANEOUS) ×1 IMPLANT
IV NS IRRIG 3000ML ARTHROMATIC (IV SOLUTION) ×3 IMPLANT
KIT PROCEDURE FLUENT (KITS) ×1 IMPLANT
KIT TURNOVER CYSTO (KITS) ×3 IMPLANT
LOOP CUTTING BIPOLAR 21FR (ELECTRODE) IMPLANT
MANIPULATOR UTERINE 4.5 ZUMI (MISCELLANEOUS) ×3 IMPLANT
NDL SPNL 22GX7 QUINCKE BK (NEEDLE) IMPLANT
NEEDLE INSUFFLATION 120MM (ENDOMECHANICALS) ×3 IMPLANT
NEEDLE SPNL 22GX7 QUINCKE BK (NEEDLE) ×3 IMPLANT
NS IRRIG 1000ML POUR BTL (IV SOLUTION) ×5 IMPLANT
OBTURATOR OPTICAL STANDARD 8MM (TROCAR) ×2
OBTURATOR OPTICAL STND 8 DVNC (TROCAR) ×1
OBTURATOR OPTICALSTD 8 DVNC (TROCAR) IMPLANT
PACK ROBOT WH (CUSTOM PROCEDURE TRAY) ×3 IMPLANT
PACK ROBOTIC GOWN (GOWN DISPOSABLE) ×3 IMPLANT
PACK TRENDGUARD 450 HYBRID PRO (MISCELLANEOUS) IMPLANT
PACK VAGINAL MINOR WOMEN LF (CUSTOM PROCEDURE TRAY) ×1 IMPLANT
PAD OB MATERNITY 4.3X12.25 (PERSONAL CARE ITEMS) ×3 IMPLANT
PAD PREP 24X48 CUFFED NSTRL (MISCELLANEOUS) ×3 IMPLANT
PROTECTOR NERVE ULNAR (MISCELLANEOUS) ×2 IMPLANT
SEAL CANN UNIV 5-8 DVNC XI (MISCELLANEOUS) ×3 IMPLANT
SEAL ROD LENS SCOPE MYOSURE (ABLATOR) ×1 IMPLANT
SEAL XI 5MM-8MM UNIVERSAL (MISCELLANEOUS) ×8
SEPRAFILM MEMBRANE 5X6 (MISCELLANEOUS) IMPLANT
SET IRRIG Y TYPE TUR BLADDER L (SET/KITS/TRAYS/PACK) IMPLANT
SET SUCTION IRRIG HYDROSURG (IRRIGATION / IRRIGATOR) ×1 IMPLANT
SET TRI-LUMEN FLTR TB AIRSEAL (TUBING) ×2 IMPLANT
SHEARS HARMONIC ACE PLUS 36CM (ENDOMECHANICALS) IMPLANT
SPONGE T-LAP 4X18 ~~LOC~~+RFID (SPONGE) ×1 IMPLANT
STENT BALLN UTERINE 3CM 6FR (STENTS) IMPLANT
STENT BALLN UTERINE 4CM 6FR (STENTS) IMPLANT
SUT MNCRL AB 4-0 PS2 18 (SUTURE) ×4 IMPLANT
SUT VLOC 180 3-0 9IN GS21 (SUTURE) ×2 IMPLANT
SYR 50ML LL SCALE MARK (SYRINGE) ×2 IMPLANT
SYR 5ML LL (SYRINGE) ×2 IMPLANT
SYRINGE 3CC/18X1.5 ECLIPSE (MISCELLANEOUS) ×1 IMPLANT
SYS RETRIEVAL 5MM INZII UNIV (BASKET) ×3
SYSTEM RETRIEVL 5MM INZII UNIV (BASKET) IMPLANT
TOWEL OR 17X26 10 PK STRL BLUE (TOWEL DISPOSABLE) ×4 IMPLANT
TRAY FOLEY W/BAG SLVR 14FR LF (SET/KITS/TRAYS/PACK) ×2 IMPLANT
TRENDGUARD 450 HYBRID PRO PACK (MISCELLANEOUS) ×3
WATER STERILE IRR 1000ML POUR (IV SOLUTION) ×1 IMPLANT

## 2021-06-16 NOTE — Transfer of Care (Signed)
Immediate Anesthesia Transfer of Care Note  Patient: Melinda Castillo  Procedure(s) Performed: Procedure(s) (LRB): XI ROBOTIC ASSISTED LAPAROSCOPIC REPAIR CESEARAN SECTION SCAR DEFECT, LYSIS OF ADHESIONS (N/A)  Patient Location: PACU  Anesthesia Type: General  Level of Consciousness: awake, oriented, sedated and patient cooperative  Airway & Oxygen Therapy: Patient Spontanous Breathing and Patient connected to face mask oxygen  Post-op Assessment: Report given to PACU RN and Post -op Vital signs reviewed and stable  Post vital signs: Reviewed and stable  Complications: No apparent anesthesia complications Vitals Value Taken Time  BP 145/97 06/16/21 1605  Temp    Pulse 67 06/16/21 1609  Resp 18 06/16/21 1609  SpO2 96 % 06/16/21 1609  Vitals shown include unvalidated device data.  Last Pain:  Vitals:   06/16/21 1159  TempSrc: Oral  PainSc: 0-No pain      Patients Stated Pain Goal: 5 (06/16/21 1159)  Complications: No notable events documented.

## 2021-06-16 NOTE — Interval H&P Note (Signed)
History and Physical Interval Note:  06/16/2021 1:47 PM  Melinda Castillo  has presented today for surgery, with the diagnosis of CESEAREN SECTION SCAR DEFECT.  The various methods of treatment have been discussed with the patient and family. After consideration of risks, benefits and other options for treatment, the patient has consented to  Procedure(s): XI ROBOTIC ASSISTED LAPAROSCOPIC REPAIR CESEARAN SECTION SCAR DEFECT (N/A) POSSIBLE DILATATION AND CURETTAGE (N/A) as a surgical intervention.  The patient's history has been reviewed, patient examined, no change in status, stable for surgery.  I have reviewed the patient's chart and labs.  Questions were answered to the patient's satisfaction.     [name]

## 2021-06-16 NOTE — Discharge Instructions (Addendum)
DISCHARGE INSTRUCTIONS: Laparoscopy  The following instructions have been prepared to help you care for yourself upon your return home today.  Wound care:  Do not get the incision wet for the first 24 hours. The incision should be kept clean and dry.  The Band-Aids or dressings may be removed the day after surgery.  Should the incision become sore, red, and swollen after the first week, check with your doctor.  Personal hygiene:  Shower the day after your procedure.  Activity and limitations:  Do NOT drive or operate any equipment today.  Do NOT lift anything more than 15 pounds for 2-3 weeks after surgery.  Do NOT rest in bed all day.  Walking is encouraged. Walk each day, starting slowly with 5-minute walks 3 or 4 times a day. Slowly increase the length of your walks.  Walk up and down stairs slowly.  Do NOT do strenuous activities, such as golfing, playing tennis, bowling, running, biking, weight lifting, gardening, mowing, or vacuuming for 2-4 weeks. Ask your doctor when it is okay to start.  Diet: Eat a light meal as desired this evening. You may resume your usual diet tomorrow.  Return to work: This is dependent on the type of work you do. For the most part you can return to a desk job within a week of surgery. If you are more active at work, please discuss this with your doctor.  What to expect after your surgery: You may have a slight burning sensation when you urinate on the first day. You may have a very small amount of blood in the urine. Expect to have a small amount of vaginal discharge/light bleeding for 1-2 weeks. It is not unusual to have abdominal soreness and bruising for up to 2 weeks. You may be tired and need more rest for about 1 week. You may experience shoulder pain for 24-72 hours. Lying flat in bed may relieve it.  Call your doctor for any of the following:  Develop a fever of 100.4 or greater  Inability to urinate 6 hours after discharge from hospital  Severe  pain not relieved by pain medications  Persistent of heavy bleeding at incision site  Redness or swelling around incision site after a week  Increasing nausea or vomiting  Post Anesthesia Home Care Instructions  Activity: Get plenty of rest for the remainder of the day. A responsible individual must stay with you for 24 hours following the procedure.  For the next 24 hours, DO NOT: -Drive a car -Advertising copywriter -Drink alcoholic beverages -Take any medication unless instructed by your physician -Make any legal decisions or sign important papers.  Meals: Start with liquid foods such as gelatin or soup. Progress to regular foods as tolerated. Avoid greasy, spicy, heavy foods. If nausea and/or vomiting occur, drink only clear liquids until the nausea and/or vomiting subsides. Call your physician if vomiting continues.  Special Instructions/Symptoms: Your throat may feel dry or sore from the anesthesia or the breathing tube placed in your throat during surgery. If this causes discomfort, gargle with warm salt water. The discomfort should disappear within 24 hours.  No ibuprofen, Advil, Aleve, Motrin, ketorolac, meloxicam, naproxen, or other NSAIDS until after 9:45 pm today if needed.

## 2021-06-16 NOTE — Op Note (Signed)
OPERATIVE NOTE:   Preoperative diagnosis: C-section scar defect (isthmocele)  Postoperative diagnosis: C-section scar defect (isthmocele)  Procedure: Laparoscopy, robotic assisted lysis of adhesions, repair of C-section scar defect, excision of peritoneal lesions  Anesthesia: Gen. endotracheal   Surgeon: [name]   Assistant: Herma Mering, RNFA  Complications: None   Estimated blood loss: 30 mL  Specimens: C-section scar mass, C-section scar contents and debridement, right ovarian fossa lesion , peritoneal excisional biopsy to pathology.   Findings: On exam under anesthesia, external genitalia, Bartholin's, Skene's, urethra and vagina were normal.  The cervix was located high in the vaginal canal.  Uterus was of normal size, anteverted, and mobile.  There was no posterior fornix nodularity palpable.  There were no adnexal masses palpable.  The uterus sounded to 11 cm. On laparoscopy, liver edge, gallbladder, diaphragm surfaces were normal.  The appendix was not visualized. Anterior fundal aspect of the uterus was adherent to the anterior abdominal wall with dense adhesions.  This area was freed up and excess fundal myometrium was extirpated. There were omental adhesions to the midline abdominal wall down to the C-section scar at the vesicouterine flexion.  There was organizing fibrin/clot to the left of the anterior cul-de-sac which had formed a rounded mass which was removed.  Upon cutting the vesicouterine peritoneum the C-section scar defect was encountered measuring 4 x 2 x 2 cm and filled with debris. Both tubes appeared normal proximally and distally with 4 out of 5 fimbria.  The left ovary appeared normal.  The right ovary was adherent to the ovarian fossa with dense adhesion which was excised and submitted to pathology.  There were no other peritoneal lesions.  Description of the procedure: Patient was placed in lithotomy position and general endotracheal anesthesia was  given.  2 g of cefazolin were given intravenously for prophylaxis. She was prepped and draped in sterile manner. A Foley catheter was inserted into the bladder appeared . A ZUMI uterine manipulator was inserted into the uterus for uterine manipulation and chromotubation. The uterus sounded to 11 cm.    An operative field was created on the abdomen and the surgeon was regloved. After preemptive anesthesia with quarter percent bupivacaine, and intraumbilical skin incision was made. A Verress needle was inserted and pneumoperitoneum was created with carbon dioxide. An 8 mm trocar was placed at this incision. Robotic laparoscope with 3-D camera was inserted and, under direct visualization, 2 left lateral 26mm incisions were made and corresponding robotic trochars were placed. On the left side an 8 mm assistant port was placed under direct visualization.  The [employer] XI robot was docked to the patient after placing her in Trendelenburg position. The surgeon continued the rest of the procedure from the surgical console.  A dilute vasopressin solution (0.33 units per milliliter) was injected transcutaneously into the myometrium of the uterus.  The anterior abdominal wall adhesions, the adhesions along the mesentery of the sigmoid colon to the pelvic sidewall were taken down by electrosurgical dissection.  The right ovary was separated and the adhesion was excised.  The fundal myometrial adhesion to the anterior abdominal wall was also lysed and the excess myometrial tissue was extirpated and the defect that was created was oversewn with 2 oh VueLock suture in figure-of-eight manner. The anterior cul-de-sac adhesions were lysed by blunt and sharp as well as electrosurgical dissection and the peritoneum overlying scar defect was cut in a transverse manner, immediately exposing the debris and blood clot in the poorly healed C-section scar.  This area was thoroughly debrided and hemostasis was assured.  The epithelial  covered edges of the defect were debrided creating healthy tissue edges.  There is a Surveyor, mining and the canal was identified.  Next we repaired the scar defect in 3 layers.  The first layer was a 2 oh V-Loc continuous suture including the endometrial and uterine wall, second layer was a superficial uterine wall horizontal imbricating suture and the third layer was a 3 oh V-Loc continuous suture imbricating the serosa.  Good approximation was achieved and hemostasis was ensured. Specimens were taken out of the abdomen with an Endo Catch device. Pelvis was copiously irrigated with lactated Ringer solution and a large sheet of Interceed was laid over the anterior surface of the uterus as well as the vesicouterine flexion as an adhesion barrier.  The port sites were approximated with 4-0 Monocryl in subcuticular sutures.  The skin incisions were approximated with Dermabond.   At this point the procedure was terminated hemostasis was insured. Instrument and lap pad count was correct.   The patient tolerated the procedure well and was transferred to recovery room in satisfactory condition.  Special Note: Due to to significant myometrial incision, the patient is recommended to have cesarean delivery with future pregnancies     [name], MD

## 2021-06-16 NOTE — Anesthesia Postprocedure Evaluation (Signed)
Anesthesia Post Note  Patient: Charlen Bakula  Procedure(s) Performed: XI ROBOTIC ASSISTED LAPAROSCOPIC REPAIR CESEARAN SECTION SCAR DEFECT, LYSIS OF ADHESIONS     Patient location during evaluation: PACU Anesthesia Type: General Level of consciousness: awake and alert Pain management: pain level controlled Vital Signs Assessment: post-procedure vital signs reviewed and stable Respiratory status: spontaneous breathing, nonlabored ventilation, respiratory function stable and patient connected to nasal cannula oxygen Cardiovascular status: blood pressure returned to baseline and stable Postop Assessment: no apparent nausea or vomiting Anesthetic complications: no   No notable events documented.  Last Vitals:  Vitals:   06/16/21 1630 06/16/21 1645  BP: (!) 137/93 (!) 129/97  Pulse: 90 78  Resp: (!) 24 15  Temp:  36.6 C  SpO2: 95% 94%    Last Pain:  Vitals:   06/16/21 1629  TempSrc:   PainSc: 6                  Kiely Cousar

## 2021-06-16 NOTE — Anesthesia Procedure Notes (Signed)
Procedure Name: Intubation Date/Time: 06/16/2021 1:57 PM Performed by: Suan Halter, CRNA Pre-anesthesia Checklist: Patient identified, Emergency Drugs available, Suction available and Patient being monitored Patient Re-evaluated:Patient Re-evaluated prior to induction Oxygen Delivery Method: Circle system utilized Preoxygenation: Pre-oxygenation with 100% oxygen Induction Type: IV induction Ventilation: Mask ventilation without difficulty Laryngoscope Size: Mac and 3 Grade View: Grade I Tube type: Oral Tube size: 7.0 mm Number of attempts: 1 Airway Equipment and Method: Stylet and Oral airway Placement Confirmation: ETT inserted through vocal cords under direct vision, positive ETCO2 and breath sounds checked- equal and bilateral Secured at: 22 cm Tube secured with: Tape Dental Injury: Teeth and Oropharynx as per pre-operative assessment

## 2021-06-17 ENCOUNTER — Encounter (HOSPITAL_BASED_OUTPATIENT_CLINIC_OR_DEPARTMENT_OTHER): Payer: Self-pay | Admitting: Obstetrics and Gynecology

## 2021-06-18 LAB — SURGICAL PATHOLOGY

## 2021-07-26 ENCOUNTER — Other Ambulatory Visit (HOSPITAL_COMMUNITY): Payer: Self-pay

## 2021-09-29 ENCOUNTER — Other Ambulatory Visit (HOSPITAL_COMMUNITY): Payer: Self-pay

## 2021-09-29 ENCOUNTER — Ambulatory Visit: Payer: 59 | Admitting: Family Medicine

## 2021-09-29 ENCOUNTER — Encounter: Payer: Self-pay | Admitting: Family Medicine

## 2021-09-29 VITALS — BP 135/100 | HR 112 | Temp 100.0°F | Ht 69.0 in | Wt 239.0 lb

## 2021-09-29 DIAGNOSIS — E669 Obesity, unspecified: Secondary | ICD-10-CM | POA: Diagnosis not present

## 2021-09-29 DIAGNOSIS — R030 Elevated blood-pressure reading, without diagnosis of hypertension: Secondary | ICD-10-CM | POA: Diagnosis not present

## 2021-09-29 DIAGNOSIS — E1169 Type 2 diabetes mellitus with other specified complication: Secondary | ICD-10-CM | POA: Diagnosis not present

## 2021-09-29 DIAGNOSIS — J301 Allergic rhinitis due to pollen: Secondary | ICD-10-CM | POA: Diagnosis not present

## 2021-09-29 DIAGNOSIS — J0100 Acute maxillary sinusitis, unspecified: Secondary | ICD-10-CM | POA: Diagnosis not present

## 2021-09-29 MED ORDER — GUAIFENESIN-CODEINE 100-10 MG/5ML PO SOLN: 5.0000 mL | Freq: Four times a day (QID) | ORAL | 0 refills | Status: DC | PRN

## 2021-09-29 MED ORDER — AMOXICILLIN-POT CLAVULANATE 875-125 MG PO TABS
1.0000 | ORAL_TABLET | Freq: Two times a day (BID) | ORAL | 0 refills | Status: DC
  Filled 2021-09-29: qty 20, 10d supply, fill #0

## 2021-09-29 MED ORDER — GUAIFENESIN-CODEINE 100-10 MG/5ML PO SOLN
5.0000 mL | Freq: Four times a day (QID) | ORAL | 0 refills | Status: DC | PRN
  Filled 2021-09-29: qty 120, 6d supply, fill #0

## 2021-09-29 MED ORDER — AMOXICILLIN-POT CLAVULANATE 875-125 MG PO TABS: 1.0000 | ORAL_TABLET | Freq: Two times a day (BID) | ORAL | 0 refills | Status: AC

## 2021-09-29 NOTE — Progress Notes (Signed)
? ?Subjective  ? ?CC:  ?Chief Complaint  ?Patient presents with  ? Cough  ?  Productive/yellow in color. Covid Test taking this week was negative.  ? Nasal Congestion  ? Sinusitis  ?  Symptoms started last Monday. She was given a Z pack, she also took OTC Mucinex, Flonase, Zyrtec and Benadryl. She says son had double ear infection.   ? ? ?HPI: Melinda Castillo is a 36 y.o. female who presents to the office today to address the problems listed above in the chief complaint. ?Patient reports sinus congestion and pressure with thick drainage, mild nonproductive cough, ear pressure without pain, and malaise.  Symptoms have been present for 10-11 days. Started as allergy flare. Shedenies high fevers, GI symptoms, shortness of breath. Shehas had sinus infections in the past and this feels similar.  Patient is not a non-smoker.  No history of asthma or COPD. 2 yo son treated for bilateral otitis that started at same time as her illness. On allergy and cold meds. Cough is keeping her awake. Completed zpak 2 days ago but still with worsening sxs.  ?DM is controlled. No h/o HTN. Hasn't eaten or drank much today. Had to leave work due to [employer]. Has frontal headache and sinus pain now.  ? ?I reviewed the patients updated PMH, FH, and SocHx.  ?  ?Patient Active Problem List  ? Diagnosis Date Noted  ? S/P dilatation and curettage 03/01/2021  ? Pregnancy in wall of uterus 03/01/2021  ? Diabetes mellitus type 2 in obese (HCC) 11/02/2016  ? Vitamin D deficiency 08/22/2011  ? ?Current Meds  ?Medication Sig  ? Cetirizine HCl (ZYRTEC ALLERGY) 10 MG CAPS Take 10 mg by mouth daily as needed.  ? metFORMIN (GLUCOPHAGE-XR) 500 MG 24 hr tablet TAKE 1 TABLET BY MOUTH EVERY MORNING AND AT BEDTIME (Patient taking differently: 500 mg 2 (two) times daily.)  ? Multiple Vitamin (MULTIVITAMIN ADULT PO) Take by mouth daily.  ? ondansetron (ZOFRAN) 4 MG tablet Take 1 tablet (4 mg total) by mouth daily as needed for nausea or vomiting.  ? [DISCONTINUED]  amoxicillin-clavulanate (AUGMENTIN) 875-125 MG tablet Take 1 tablet by mouth 2 (two) times daily for 10 days.  ? [DISCONTINUED] guaiFENesin-codeine 100-10 MG/5ML syrup Take 5 mLs by mouth every 6 (six) hours as needed for cough.  ? ? ?Review of Systems: ?Cardiovascular: negative for chest pain ?Respiratory: negative for SOB or persistent cough ?Gastrointestinal: negative for abdominal pain ?Genitourinary: negative for dysuria or gross hematuria ? ?Objective  ?Vitals: BP (!) 135/100   Pulse (!) 112   Temp 100 ?F (37.8 ?C) (Temporal)   Ht 5\' 9"  (1.753 m)   Wt 239 lb (108.4 kg)   LMP 12/20/2020   SpO2 98%   BMI 35.29 kg/m?  ?General: non toxic appearing but looks like she doesn't feel well ?Psych:  Alert and oriented, normal mood and affect ?HEENT:  Normocephalic, atraumatic, TMs with serous effusions or retraction w/o erythema, nasal mucosa is red with purulent drainage, tender maxillary sinus present, OP mild erythematous w/o eudate, supple neck without LAD, ? Enlarged thyroid. Non toxic ?Cardiovascular:  RRR without murmur or gallop. no peripheral edema ?Respiratory:  Good breath sounds bilaterally, CTAB with normal respiratory effort ?Skin:  Warm, no rashes ?Neurologic:   Mental status is normal. normal gait ? ?Assessment  ?1. Acute non-recurrent maxillary sinusitis   ?2. Seasonal allergic rhinitis due to pollen   ?3. Elevated blood pressure reading without diagnosis of hypertension   ?4. Diabetes mellitus type  2 in obese [facility])   ? ?  ?Plan  ? ?Sinusitis: History and exam is most consistent with bacterial sinus infection.  Etiology and prognosis discussed with patient.  Recommend antibiotics as ordered below.  Patient to complete course of antibiotics, use supportive medications like mucolytics and decongestants as needed.  May use Tylenol or Advil if needed.  Symptoms should improve over the next 2 weeks.  Patient will return or call if symptoms persist or worsen. ?Elevated bp likely due to illness and  headache. Can f/u with home or work readings.  ?? Enlarged thyroid. To f/u with PCP for recheck. Tachy today likely due to dehydration but can check TSH in future if persists.  ?Continue allergy meds ? ?Follow up: as needed  ? ?Commons side effects, risks, benefits, and alternatives for medications and treatment plan prescribed today were discussed, and the patient expressed understanding of the given instructions. Patient is instructed to call or message via MyChart if he/she has any questions or concerns regarding our treatment plan. No barriers to understanding were identified. We discussed Red Flag symptoms and signs in detail. Patient expressed understanding regarding what to do in case of urgent or emergency type symptoms.  ?Medication list was reconciled, printed and provided to the patient in AVS. Patient instructions and summary information was reviewed with the patient as documented in the AVS. ?This note was prepared with assistance of Conservation officer, historic buildings. Occasional wrong-word or sound-a-like substitutions may have occurred due to the inherent limitations of voice recognition software ? ?No orders of the defined types were placed in this encounter. ? ?Meds ordered this encounter  ?Medications  ? DISCONTD: amoxicillin-clavulanate (AUGMENTIN) 875-125 MG tablet  ?  Sig: Take 1 tablet by mouth 2 (two) times daily for 10 days.  ?  Dispense:  20 tablet  ?  Refill:  0  ? DISCONTD: guaiFENesin-codeine 100-10 MG/5ML syrup  ?  Sig: Take 5 mLs by mouth every 6 (six) hours as needed for cough.  ?  Dispense:  120 mL  ?  Refill:  0  ? amoxicillin-clavulanate (AUGMENTIN) 875-125 MG tablet  ?  Sig: Take 1 tablet by mouth 2 (two) times daily for 10 days.  ?  Dispense:  20 tablet  ?  Refill:  0  ? guaiFENesin-codeine 100-10 MG/5ML syrup  ?  Sig: Take 5 mLs by mouth every 6 (six) hours as needed for cough.  ?  Dispense:  120 mL  ?  Refill:  0  ? ?  ? ? ? ? ?

## 2021-09-29 NOTE — Patient Instructions (Signed)
Please follow up if symptoms do not improve or as needed.   ? ?Sinusitis, Adult ?Sinusitis is soreness and swelling (inflammation) of your sinuses. Sinuses are hollow spaces in the bones around your face. They are located: ?Around your eyes. ?In the middle of your forehead. ?Behind your nose. ?In your cheekbones. ?Your sinuses and nasal passages are lined with a fluid called mucus. Mucus drains out of your sinuses. Swelling can trap mucus in your sinuses. This lets germs (bacteria, virus, or fungus) grow, which leads to infection. Most of the time, this condition is caused by a virus. ?What are the causes? ?This condition is caused by: ?Allergies. ?Asthma. ?Germs. ?Things that block your nose or sinuses. ?Growths in the nose (nasal polyps). ?Chemicals or irritants in the air. ?Fungus (rare). ?What increases the risk? ?You are more likely to develop this condition if: ?You have a weak body defense system (immune system). ?You do a lot of swimming or diving. ?You use nasal sprays too much. ?You smoke. ?What are the signs or symptoms? ?The main symptoms of this condition are pain and a feeling of pressure around the sinuses. Other symptoms include: ?Stuffy nose (congestion). ?Runny nose (drainage). ?Swelling and warmth in the sinuses. ?Headache. ?Toothache. ?A cough that may get worse at night. ?Mucus that collects in the throat or the back of the nose (postnasal drip). ?Being unable to smell and taste. ?Being very tired (fatigue). ?A fever. ?Sore throat. ?Bad breath. ?How is this diagnosed? ?This condition is diagnosed based on: ?Your symptoms. ?Your medical history. ?A physical exam. ?Tests to find out if your condition is short-term (acute) or long-term (chronic). Your doctor may: ?Check your nose for growths (polyps). ?Check your sinuses using a tool that has a light (endoscope). ?Check for allergies or germs. ?Do imaging tests, such as an MRI or CT scan. ?How is this treated? ?Treatment for this condition depends  on the cause and whether it is short-term or long-term. ?If caused by a virus, your symptoms should go away on their own within 10 days. You may be given medicines to relieve symptoms. They include: ?Medicines that shrink swollen tissue in the nose. ?Medicines that treat allergies (antihistamines). ?A spray that treats swelling of the nostrils.  ?Rinses that help get rid of thick mucus in your nose (nasal saline washes). ?If caused by bacteria, your doctor may wait to see if you will get better without treatment. You may be given antibiotic medicine if you have: ?A very bad infection. ?A weak body defense system. ?If caused by growths in the nose, you may need to have surgery. ?Follow these instructions at home: ?Medicines ?Take, use, or apply over-the-counter and prescription medicines only as told by your doctor. These may include nasal sprays. ?If you were prescribed an antibiotic medicine, take it as told by your doctor. Do not stop taking the antibiotic even if you start to feel better. ?Hydrate and humidify ? ?Drink enough water to keep your pee (urine) pale yellow. ?Use a cool mist humidifier to keep the humidity level in your home above 50%. ?Breathe in steam for 10-15 minutes, 3-4 times a day, or as told by your doctor. You can do this in the bathroom while a hot shower is running. ?Try not to spend time in cool or dry air. ?Rest ?Rest as much as you can. ?Sleep with your head raised (elevated). ?Make sure you get enough sleep each night. ?General instructions ? ?Put a warm, moist washcloth on your face 3-4 times a  day, or as often as told by your doctor. This will help with discomfort. ?Wash your hands often with soap and water. If there is no soap and water, use hand sanitizer. ?Do not smoke. Avoid being around people who are smoking (secondhand smoke). ?Keep all follow-up visits as told by your doctor. This is important. ?Contact a doctor if: ?You have a fever. ?Your symptoms get worse. ?Your symptoms do  not get better within 10 days. ?Get help right away if: ?You have a very bad headache. ?You cannot stop throwing up (vomiting). ?You have very bad pain or swelling around your face or eyes. ?You have trouble seeing. ?You feel confused. ?Your neck is stiff. ?You have trouble breathing. ?Summary ?Sinusitis is swelling of your sinuses. Sinuses are hollow spaces in the bones around your face. ?This condition is caused by tissues in your nose that become inflamed or swollen. This traps germs. These can lead to infection. ?If you were prescribed an antibiotic medicine, take it as told by your doctor. Do not stop taking it even if you start to feel better. ?Keep all follow-up visits as told by your doctor. This is important. ?This information is not intended to replace advice given to you by your health care provider. Make sure you discuss any questions you have with your health care provider. ?Document Revised: 11/13/2017 Document Reviewed: 11/13/2017 ?Elsevier Patient Education ? 2022 Elsevier Inc. ? ?

## 2021-10-21 DIAGNOSIS — Z3200 Encounter for pregnancy test, result unknown: Secondary | ICD-10-CM | POA: Diagnosis not present

## 2021-10-25 DIAGNOSIS — E288 Other ovarian dysfunction: Secondary | ICD-10-CM | POA: Diagnosis not present

## 2021-10-25 DIAGNOSIS — Z3201 Encounter for pregnancy test, result positive: Secondary | ICD-10-CM | POA: Diagnosis not present

## 2021-10-27 ENCOUNTER — Ambulatory Visit: Payer: 59 | Admitting: Allergy & Immunology

## 2021-10-27 ENCOUNTER — Other Ambulatory Visit (HOSPITAL_COMMUNITY): Payer: Self-pay

## 2021-11-02 ENCOUNTER — Other Ambulatory Visit (HOSPITAL_COMMUNITY): Payer: Self-pay

## 2021-11-02 ENCOUNTER — Ambulatory Visit: Payer: 59 | Admitting: Family Medicine

## 2021-11-02 ENCOUNTER — Encounter: Payer: 59 | Admitting: Family Medicine

## 2021-11-02 VITALS — BP 117/80 | HR 85 | Temp 97.3°F | Ht 69.0 in | Wt 241.0 lb

## 2021-11-02 DIAGNOSIS — E049 Nontoxic goiter, unspecified: Secondary | ICD-10-CM

## 2021-11-02 DIAGNOSIS — E669 Obesity, unspecified: Secondary | ICD-10-CM

## 2021-11-02 DIAGNOSIS — E1169 Type 2 diabetes mellitus with other specified complication: Secondary | ICD-10-CM

## 2021-11-02 LAB — POCT GLYCOSYLATED HEMOGLOBIN (HGB A1C)
HbA1c POC (<> result, manual entry): 6.3 % (ref 4.0–5.6)
HbA1c, POC (controlled diabetic range): 6.3 % (ref 0.0–7.0)
HbA1c, POC (prediabetic range): 6.3 % (ref 5.7–6.4)
Hemoglobin A1C: 6.3 % — AB (ref 4.0–5.6)

## 2021-11-02 MED ORDER — METFORMIN HCL ER 500 MG PO TB24
ORAL_TABLET | ORAL | 1 refills | Status: DC
  Filled 2021-11-02: qty 180, fill #0
  Filled 2022-01-16: qty 180, 90d supply, fill #0

## 2021-11-02 NOTE — Progress Notes (Signed)
? ? ? ?Patient ID: Melinda Castillo, female  DOB: 11/28/1985, 36 y.o.   MRN: [id number] ?Patient Care Team  ?  Relationship Specialty Notifications Start End  ?Ma Hillock, DO PCP - General Family Medicine  07/22/20   ?Haynes Dage, OD  Optometry  07/22/20   ?Janie Morning, MD  Gastroenterology  07/22/20   ?Paula Compton, MD Consulting Physician Obstetrics and Gynecology  11/02/21   ? ? ?Chief Complaint  ?Patient presents with  ? Diabetes  ?  [facility]; pt is not fasting  ? ? ?Subjective: ?Melinda Castillo is a 36 y.o.  female present for [facility] ?Diabetes type 2: ?Pt reports compliance with metformin 500 ER BID. She did have Insulin with pregnancy.Patient denies dizziness, hyperglycemic or hypoglycemic events. Patient denies dizziness, hyperglycemic or hypoglycemic events. Patient denies numbness, tingling in the extremities or nonhealing wounds of feet.  ?She unfortunately underwent D&C/E secondary to implantation into uterine wall of her 2nd pregnancy. She reports she is currently pregnant with her 3rd pregnancy and she will start following with OB/specialist this Friday.  ? ? ? ?  09/29/2021  ?  2:11 PM 05/03/2021  ?  3:35 PM 07/22/2020  ?  3:17 PM  ?Depression screen PHQ 2/9  ?Decreased Interest 0 0 0  ?Down, Depressed, Hopeless 0 0 0  ?PHQ - 2 Score 0 0 0  ? ?   ? View : No data to display.  ?  ?  ?  ? ?  ?  ? ?  09/29/2021  ?  2:18 PM 07/22/2020  ?  3:17 PM  ?Fall Risk   ?Falls in the past year? 0 0  ?Number falls in past yr: 0 0  ?Injury with Fall? 0 0  ?Risk for fall due to : No Fall Risks   ?Follow up Falls evaluation completed Falls evaluation completed  ? ?Immunization History  ?Administered Date(s) Administered  ? HPV Quadrivalent 01/29/2009  ? Hepatitis B 03/08/2011  ? Influenza Split 03/28/2013  ? Influenza, Quadrivalent, Recombinant, Inj, Pf 03/28/2019  ? Influenza, Seasonal, Injecte, Preservative Fre 03/27/2018, 03/28/2019  ? Influenza-Unspecified 03/27/2020, 03/27/2021  ? MMR 05/14/1994  ?  PFIZER(Purple Top)SARS-COV-2 Vaccination 06/29/2019, 07/20/2019, 03/26/2020  ? Pneumococcal Polysaccharide-23 07/22/2020  ? Rho (D) Immune Globulin 07/15/2019, 09/26/2019  ? Tdap 08/02/2019  ? ? ?No results found. ? ?Past Medical History:  ?Diagnosis Date  ? History of ectopic pregnancy 01/2021  ? treated with medication  ? History of positive PPD 2010  ? per pt skin test, and had negative cxr  ? History of pregnancy induced hypertension   ? 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 Formatting of this note might be different from the original. IVF pregnancy through Dr Rolin Barry Day 5 embryo transfer. PGS-A testing completed  ? Pelvic hematoma, female   ? Type 2 diabetes mellitus ([place])   ? followed by pcp  (06-10-2021  pt stated does not check blood sugar at home)  ? Wears contact lenses   ? ?No Known Allergies ?Past Surgical History:  ?Procedure Laterality Date  ? BREAST REDUCTION SURGERY Bilateral 2013  ? CESAREAN SECTION  09/25/2019  ? [facility]'   ? COLONOSCOPY WITH ESOPHAGOGASTRODUODENOSCOPY (EGD)  07/2018  ? r/o celiac- completed for IBS sx. Normal.   ? DILATION AND CURETTAGE OF UTERUS N/A 03/01/2021  ? Procedure: DILATATION AND CURETTAGE WITH ULTRASOUND GUIDANCE;  Surgeon: Governor Specking, MD;  Location: [place];  Service: Gynecology;  Laterality: N/A;  ? IR ANGIOGRAM PELVIS SELECTIVE OR SUPRASELECTIVE  03/03/2021  ?  IR ANGIOGRAM SELECTIVE EACH ADDITIONAL VESSEL  03/03/2021  ? IR ANGIOGRAM SELECTIVE EACH ADDITIONAL VESSEL  03/03/2021  ? IR EMBO ART  VEN HEMORR LYMPH EXTRAV  INC GUIDE ROADMAPPING  03/02/2021  ? IR EMBO ART  VEN HEMORR LYMPH EXTRAV  INC GUIDE ROADMAPPING  03/02/2021  ? IR US GUIDE VASC ACCESS RIGHT  03/02/2021  ? ROBOTIC ASSISTED LAPAROSCOPIC LYSIS OF ADHESION N/A 06/16/2021  ? Procedure: XI ROBOTIC ASSISTED LAPAROSCOPIC REPAIR CESEARAN SECTION SCAR DEFECT, LYSIS OF ADHESIONS;  Surgeon: Governor Specking, MD;  Location: [facility];  Service: Gynecology;  Laterality: N/A;  ? TONSILLECTOMY  2006   ? ?Family History  ?Problem Relation Age of Onset  ? Hypertension Mother   ? Hypertension Father   ? Ovarian cancer Maternal Grandmother   ? Early death Maternal Grandmother   ? Heart disease Maternal Grandfather   ? Brain cancer Paternal Grandfather   ? Stroke Paternal Grandmother   ? Colon cancer Neg Hx   ? Colon polyps Neg Hx   ? ?Social History  ? ?Social History Narrative  ? Marital status/children/pets: married, 1 child.   ? Education/employment: Statistician, works as Immunologist at [employer].   ? Safety:   ?   -Wears a bicycle helmet riding a bike: Yes  ?   -smoke alarm in the home:Yes  ?   - wears seatbelt: Yes  ?   - Feels safe in their relationships: Yes  ? ? ?Allergies as of 11/02/2021   ?No Known Allergies ?  ? ?  ?Medication List  ?  ? ?  ? Accurate as of Nov 02, 2021  5:05 PM. If you have any questions, ask your nurse or doctor.  ?  ?  ? ?  ? ?STOP taking these medications   ? ?guaiFENesin-codeine 100-10 MG/5ML syrup ?Stopped by: Howard Pouch, DO ?  ?ondansetron 4 MG tablet ?Commonly known as: Zofran ?Stopped by: Howard Pouch, DO ?  ? ?  ? ?TAKE these medications   ? ?metFORMIN 500 MG 24 hr tablet ?Commonly known as: GLUCOPHAGE-XR ?TAKE 1 TABLET BY MOUTH EVERY MORNING AND AT BEDTIME ?What changed:  ?how much to take ?when to take this ?  ?MULTIVITAMIN ADULT PO ?Take by mouth daily. ?  ?ZyrTEC Allergy 10 MG Caps ?Generic drug: Cetirizine HCl ?Take 10 mg by mouth daily as needed. ?  ? ?  ? ? ?All past medical history, surgical history, allergies, family history, immunizations andmedications were updated in the EMR today and reviewed under the history and medication portions of their EMR.    ? ? ?Patient was never admitted. ? ? ?ROS: 14 pt review of systems performed and negative (unless mentioned in an HPI) ? ?Objective: ?BP 117/80   Pulse 85   Temp (!) 97.3 ?F (36.3 ?C) (Oral)   Ht '5\' 9"'  (1.753 m)   Wt 241 lb (109.3 kg)   LMP 09/20/2021   SpO2 100%   Breastfeeding Unknown   BMI 35.59 kg/m?   ?Physical Exam ?Vitals and nursing note reviewed.  ?Constitutional:   ?   General: She is not in acute distress. ?   Appearance: Normal appearance. She is obese. She is not ill-appearing, toxic-appearing or diaphoretic.  ?HENT:  ?   Head: Normocephalic and atraumatic.  ?Eyes:  ?   General: No scleral icterus.    ?   Right eye: No discharge.     ?   Left eye: No discharge.  ?   Extraocular Movements: Extraocular  movements intact.  ?   Conjunctiva/sclera: Conjunctivae normal.  ?   Pupils: Pupils are equal, round, and reactive to light.  ?Neck:  ?   Comments: Mild right thyromegaly ?Cardiovascular:  ?   Rate and Rhythm: Normal rate and regular rhythm.  ?Pulmonary:  ?   Effort: Pulmonary effort is normal. No respiratory distress.  ?   Breath sounds: Normal breath sounds. No wheezing, rhonchi or rales.  ?Musculoskeletal:  ?   Cervical back: Neck supple. No tenderness.  ?   Right lower leg: No edema.  ?   Left lower leg: No edema.  ?Lymphadenopathy:  ?   Cervical: No cervical adenopathy.  ?Skin: ?   General: Skin is warm and dry.  ?   Coloration: Skin is not jaundiced or pale.  ?   Findings: No erythema or rash.  ?Neurological:  ?   Mental Status: She is alert and oriented to person, place, and time. Mental status is at baseline.  ?   Motor: No weakness.  ?   Gait: Gait normal.  ?Psychiatric:     ?   Mood and Affect: Mood normal.     ?   Behavior: Behavior normal.     ?   Thought Content: Thought content normal.     ?   Judgment: Judgment normal.  ? ?Diabetic Foot Exam - Simple   ?Simple Foot Form ?Diabetic Foot exam was performed with the following findings: Yes 11/02/2021  4:04 PM  ?Visual Inspection ?No deformities, no ulcerations, no other skin breakdown bilaterally: Yes ?Sensation Testing ?Intact to touch and monofilament testing bilaterally: Yes ?Pulse Check ?Posterior Tibialis and Dorsalis pulse intact bilaterally: Yes ?Comments ?  ? ? ?Assessment/plan: ?Melinda Castillo is a 36 y.o. female present for  [facility] ?Diabetes mellitus type 2 in obese [facility]) ?Stable ?Continue metformin to 500 mg BID ?- diabetic diet ?- continue  routine exercise.  ?PNA series: PNA23 UTD ?Flu shot: UTD 2022 (recommneded yearly) ?Microalb: collected today ?Foot exam

## 2021-11-03 LAB — MICROALBUMIN / CREATININE URINE RATIO
Creatinine,U: 30.5 mg/dL
Microalb Creat Ratio: 2.3 mg/g (ref 0.0–30.0)
Microalb, Ur: <0.7 mg/dL (ref 0.0–1.9)

## 2021-11-05 ENCOUNTER — Ambulatory Visit: Payer: 59 | Admitting: Family Medicine

## 2021-11-05 DIAGNOSIS — Z3200 Encounter for pregnancy test, result unknown: Secondary | ICD-10-CM | POA: Diagnosis not present

## 2021-11-18 DIAGNOSIS — O0900 Supervision of pregnancy with history of infertility, unspecified trimester: Secondary | ICD-10-CM | POA: Diagnosis not present

## 2021-11-19 ENCOUNTER — Other Ambulatory Visit (HOSPITAL_COMMUNITY): Payer: Self-pay

## 2021-11-19 DIAGNOSIS — O2621 Pregnancy care for patient with recurrent pregnancy loss, first trimester: Secondary | ICD-10-CM | POA: Diagnosis not present

## 2021-11-19 DIAGNOSIS — N96 Recurrent pregnancy loss: Secondary | ICD-10-CM | POA: Diagnosis not present

## 2021-11-19 DIAGNOSIS — O008 Other ectopic pregnancy without intrauterine pregnancy: Secondary | ICD-10-CM | POA: Diagnosis not present

## 2021-11-19 DIAGNOSIS — Z31430 Encounter of female for testing for genetic disease carrier status for procreative management: Secondary | ICD-10-CM | POA: Diagnosis not present

## 2021-11-19 MED ORDER — MISOPROSTOL 200 MCG PO TABS
ORAL_TABLET | ORAL | 1 refills | Status: DC
  Filled 2021-11-19: qty 8, 2d supply, fill #0
  Filled 2021-11-19: qty 4, 1d supply, fill #1

## 2021-11-20 DIAGNOSIS — O021 Missed abortion: Secondary | ICD-10-CM | POA: Diagnosis not present

## 2021-11-23 ENCOUNTER — Other Ambulatory Visit (HOSPITAL_COMMUNITY): Payer: Self-pay

## 2021-11-25 HISTORY — PX: DILATATION AND CURETTAGE/HYSTEROSCOPY WITH MINERVA: SHX6851

## 2021-11-26 DIAGNOSIS — O021 Missed abortion: Secondary | ICD-10-CM | POA: Diagnosis not present

## 2021-12-03 DIAGNOSIS — O021 Missed abortion: Secondary | ICD-10-CM | POA: Diagnosis not present

## 2021-12-03 DIAGNOSIS — N96 Recurrent pregnancy loss: Secondary | ICD-10-CM | POA: Diagnosis not present

## 2021-12-16 DIAGNOSIS — O2621 Pregnancy care for patient with recurrent pregnancy loss, first trimester: Secondary | ICD-10-CM | POA: Diagnosis not present

## 2021-12-16 DIAGNOSIS — Z31430 Encounter of female for testing for genetic disease carrier status for procreative management: Secondary | ICD-10-CM | POA: Diagnosis not present

## 2021-12-16 DIAGNOSIS — Z319 Encounter for procreative management, unspecified: Secondary | ICD-10-CM | POA: Diagnosis not present

## 2021-12-16 DIAGNOSIS — O008 Other ectopic pregnancy without intrauterine pregnancy: Secondary | ICD-10-CM | POA: Diagnosis not present

## 2022-01-13 ENCOUNTER — Other Ambulatory Visit (HOSPITAL_COMMUNITY): Payer: Self-pay

## 2022-01-13 DIAGNOSIS — N8500 Endometrial hyperplasia, unspecified: Secondary | ICD-10-CM | POA: Diagnosis not present

## 2022-01-13 DIAGNOSIS — O008 Other ectopic pregnancy without intrauterine pregnancy: Secondary | ICD-10-CM | POA: Diagnosis not present

## 2022-01-13 DIAGNOSIS — Z31430 Encounter of female for testing for genetic disease carrier status for procreative management: Secondary | ICD-10-CM | POA: Diagnosis not present

## 2022-01-13 DIAGNOSIS — O2621 Pregnancy care for patient with recurrent pregnancy loss, first trimester: Secondary | ICD-10-CM | POA: Diagnosis not present

## 2022-01-13 DIAGNOSIS — Z3141 Encounter for fertility testing: Secondary | ICD-10-CM | POA: Diagnosis not present

## 2022-01-13 MED ORDER — DOXYCYCLINE HYCLATE 100 MG PO CAPS
ORAL_CAPSULE | ORAL | 0 refills | Status: DC
  Filled 2022-03-31: qty 10, 5d supply, fill #0

## 2022-01-13 MED ORDER — DOXYCYCLINE HYCLATE 100 MG PO CAPS
ORAL_CAPSULE | ORAL | 0 refills | Status: DC
  Filled 2022-01-13: qty 10, 5d supply, fill #0

## 2022-01-15 ENCOUNTER — Other Ambulatory Visit (HOSPITAL_COMMUNITY): Payer: Self-pay

## 2022-01-15 MED ORDER — "BD DISP NEEDLES 30G X 1/2"" MISC"
1 refills | Status: DC
  Filled 2022-01-15: qty 20, 10d supply, fill #0

## 2022-01-15 MED ORDER — ESTRADIOL 2 MG PO TABS
2.0000 mg | ORAL_TABLET | Freq: Two times a day (BID) | ORAL | 3 refills | Status: DC
  Filled 2022-01-15: qty 60, 30d supply, fill #0

## 2022-01-15 MED ORDER — MENOPUR 75 UNITS ~~LOC~~ SOLR
SUBCUTANEOUS | 2 refills | Status: DC
  Filled 2022-01-15: qty 10, 10d supply, fill #0

## 2022-01-15 MED ORDER — PROGESTERONE 50 MG/ML IM OIL
TOPICAL_OIL | INTRAMUSCULAR | 1 refills | Status: DC
  Filled 2022-01-15: qty 30, 30d supply, fill #0

## 2022-01-15 MED ORDER — METHYLPREDNISOLONE 8 MG PO TABS
8.0000 mg | ORAL_TABLET | Freq: Two times a day (BID) | ORAL | 1 refills | Status: DC
  Filled 2022-01-15: qty 8, 4d supply, fill #0
  Filled 2022-03-31: qty 8, 4d supply, fill #1

## 2022-01-15 MED ORDER — GONAL-F 1050 UNITS IJ SOLR
INTRAMUSCULAR | 2 refills | Status: DC
  Filled 2022-01-15: qty 3, 8d supply, fill #0
  Filled 2022-02-07: qty 3, 8d supply, fill #1

## 2022-01-15 MED ORDER — GANIRELIX ACETATE 250 MCG/0.5ML ~~LOC~~ SOSY
PREFILLED_SYRINGE | SUBCUTANEOUS | 2 refills | Status: DC
  Filled 2022-01-15: qty 2.5, 5d supply, fill #0

## 2022-01-15 MED ORDER — "EASY TOUCH HYPODERMIC NEEDLE 22G X 1-1/2"" MISC"
1 refills | Status: DC
  Filled 2022-01-15: qty 60, 30d supply, fill #0

## 2022-01-15 MED ORDER — CHORIONIC GONADOTROPIN 10000 UNITS IM SOLR
INTRAMUSCULAR | 0 refills | Status: DC
  Filled 2022-01-15: qty 1, 1d supply, fill #0

## 2022-01-15 MED ORDER — DOXYCYCLINE HYCLATE 100 MG PO TABS
100.0000 mg | ORAL_TABLET | Freq: Two times a day (BID) | ORAL | 0 refills | Status: DC
  Filled 2022-01-15: qty 40, 20d supply, fill #0

## 2022-01-15 MED ORDER — "BD LUER-LOK SYRINGE 18G X 1-1/2"" 3 ML MISC"
1 refills | Status: DC
  Filled 2022-01-15 (×2): qty 60, 30d supply, fill #0

## 2022-01-15 MED ORDER — ESTRADIOL 0.1 MG/24HR TD PTTW
MEDICATED_PATCH | TRANSDERMAL | 1 refills | Status: DC
  Filled 2022-01-15: qty 8, 28d supply, fill #0

## 2022-01-17 ENCOUNTER — Other Ambulatory Visit (HOSPITAL_COMMUNITY): Payer: Self-pay

## 2022-01-18 ENCOUNTER — Other Ambulatory Visit (HOSPITAL_COMMUNITY): Payer: Self-pay

## 2022-01-19 ENCOUNTER — Other Ambulatory Visit (HOSPITAL_COMMUNITY): Payer: Self-pay

## 2022-01-20 ENCOUNTER — Other Ambulatory Visit (HOSPITAL_COMMUNITY): Payer: Self-pay

## 2022-01-20 MED ORDER — CETRORELIX ACETATE 0.25 MG ~~LOC~~ KIT
PACK | SUBCUTANEOUS | 2 refills | Status: DC
  Filled 2022-01-20: qty 5, 5d supply, fill #0

## 2022-01-21 ENCOUNTER — Other Ambulatory Visit (HOSPITAL_COMMUNITY): Payer: Self-pay

## 2022-01-22 ENCOUNTER — Other Ambulatory Visit (HOSPITAL_COMMUNITY): Payer: Self-pay

## 2022-01-27 ENCOUNTER — Other Ambulatory Visit (HOSPITAL_COMMUNITY): Payer: Self-pay

## 2022-01-27 MED ORDER — NOVAREL 5000 UNITS IM SOLR
INTRAMUSCULAR | 0 refills | Status: DC
  Filled 2022-01-27: qty 2, 1d supply, fill #0

## 2022-01-28 ENCOUNTER — Other Ambulatory Visit (HOSPITAL_COMMUNITY): Payer: Self-pay

## 2022-02-01 DIAGNOSIS — Z3183 Encounter for assisted reproductive fertility procedure cycle: Secondary | ICD-10-CM | POA: Diagnosis not present

## 2022-02-01 DIAGNOSIS — O2621 Pregnancy care for patient with recurrent pregnancy loss, first trimester: Secondary | ICD-10-CM | POA: Diagnosis not present

## 2022-02-01 DIAGNOSIS — O008 Other ectopic pregnancy without intrauterine pregnancy: Secondary | ICD-10-CM | POA: Diagnosis not present

## 2022-02-01 DIAGNOSIS — Z31430 Encounter of female for testing for genetic disease carrier status for procreative management: Secondary | ICD-10-CM | POA: Diagnosis not present

## 2022-02-01 DIAGNOSIS — Z113 Encounter for screening for infections with a predominantly sexual mode of transmission: Secondary | ICD-10-CM | POA: Diagnosis not present

## 2022-02-03 ENCOUNTER — Encounter: Payer: 59 | Admitting: Family Medicine

## 2022-02-04 ENCOUNTER — Encounter: Payer: Self-pay | Admitting: Family Medicine

## 2022-02-04 ENCOUNTER — Other Ambulatory Visit (HOSPITAL_COMMUNITY): Payer: Self-pay

## 2022-02-04 ENCOUNTER — Ambulatory Visit (INDEPENDENT_AMBULATORY_CARE_PROVIDER_SITE_OTHER): Payer: 59 | Admitting: Family Medicine

## 2022-02-04 VITALS — BP 127/85 | HR 66 | Temp 97.7°F | Ht 70.0 in | Wt 252.0 lb

## 2022-02-04 DIAGNOSIS — O008 Other ectopic pregnancy without intrauterine pregnancy: Secondary | ICD-10-CM | POA: Diagnosis not present

## 2022-02-04 DIAGNOSIS — Z0000 Encounter for general adult medical examination without abnormal findings: Secondary | ICD-10-CM | POA: Diagnosis not present

## 2022-02-04 DIAGNOSIS — E559 Vitamin D deficiency, unspecified: Secondary | ICD-10-CM | POA: Diagnosis not present

## 2022-02-04 DIAGNOSIS — Z31430 Encounter of female for testing for genetic disease carrier status for procreative management: Secondary | ICD-10-CM | POA: Diagnosis not present

## 2022-02-04 DIAGNOSIS — Z1159 Encounter for screening for other viral diseases: Secondary | ICD-10-CM

## 2022-02-04 DIAGNOSIS — Z3183 Encounter for assisted reproductive fertility procedure cycle: Secondary | ICD-10-CM | POA: Diagnosis not present

## 2022-02-04 DIAGNOSIS — E669 Obesity, unspecified: Secondary | ICD-10-CM | POA: Diagnosis not present

## 2022-02-04 DIAGNOSIS — O2621 Pregnancy care for patient with recurrent pregnancy loss, first trimester: Secondary | ICD-10-CM | POA: Diagnosis not present

## 2022-02-04 DIAGNOSIS — Z113 Encounter for screening for infections with a predominantly sexual mode of transmission: Secondary | ICD-10-CM | POA: Diagnosis not present

## 2022-02-04 DIAGNOSIS — E1169 Type 2 diabetes mellitus with other specified complication: Secondary | ICD-10-CM

## 2022-02-04 LAB — LIPID PANEL
Cholesterol: 148 mg/dL (ref 0–200)
HDL: 41.3 mg/dL (ref >39.00)
LDL Cholesterol: 91 mg/dL (ref 0–99)
NonHDL: 107.17
Total CHOL/HDL Ratio: 4
Triglycerides: 80 mg/dL (ref 0.0–149.0)
VLDL: 16 mg/dL (ref 0.0–40.0)

## 2022-02-04 LAB — COMPREHENSIVE METABOLIC PANEL
ALT: 56 U/L — ABNORMAL HIGH (ref 0–35)
AST: 29 U/L (ref 0–37)
Albumin: 4.6 g/dL (ref 3.5–5.2)
Alkaline Phosphatase: 49 U/L (ref 39–117)
BUN: 15 mg/dL (ref 6–23)
CO2: 29 mEq/L (ref 19–32)
Calcium: 9.2 mg/dL (ref 8.4–10.5)
Chloride: 103 mEq/L (ref 96–112)
Creatinine, Ser: 0.6 mg/dL (ref 0.40–1.20)
GFR: 114.39 mL/min (ref >60.00)
Glucose, Bld: 129 mg/dL — ABNORMAL HIGH (ref 70–99)
Potassium: 4.3 mEq/L (ref 3.5–5.1)
Sodium: 140 mEq/L (ref 135–145)
Total Bilirubin: 0.3 mg/dL (ref 0.2–1.2)
Total Protein: 6.9 g/dL (ref 6.0–8.3)

## 2022-02-04 LAB — CBC
HCT: 41.4 % (ref 36.0–46.0)
Hemoglobin: 13.7 g/dL (ref 12.0–15.0)
MCHC: 33.1 g/dL (ref 30.0–36.0)
MCV: 88.4 fl (ref 78.0–100.0)
Platelets: 178 10*3/uL (ref 150.0–400.0)
RBC: 4.68 Mil/uL (ref 3.87–5.11)
RDW: 13 % (ref 11.5–15.5)
WBC: 3.5 10*3/uL — ABNORMAL LOW (ref 4.0–10.5)

## 2022-02-04 LAB — TSH: TSH: 0.96 u[IU]/mL (ref 0.35–5.50)

## 2022-02-04 LAB — HEMOGLOBIN A1C: Hgb A1c MFr Bld: 7 % — ABNORMAL HIGH (ref 4.6–6.5)

## 2022-02-04 LAB — VITAMIN D 25 HYDROXY (VIT D DEFICIENCY, FRACTURES): VITD: 39.13 ng/mL (ref 30.00–100.00)

## 2022-02-04 MED ORDER — METFORMIN HCL ER 500 MG PO TB24
ORAL_TABLET | ORAL | 1 refills | Status: DC
  Filled 2022-02-04: qty 180, fill #0

## 2022-02-04 NOTE — Patient Instructions (Signed)
Return in about 24 weeks (around 07/22/2022) for Routine chronic condition follow-up.        Great to see you today.  I have refilled the medication(s) we provide.   If labs were collected, we will inform you of lab results once received either by echart message or telephone call.   - echart message- for normal results that have been seen by the patient already.   - telephone call: abnormal results or if patient has not viewed results in their echart.  Health Maintenance, Female Adopting a healthy lifestyle and getting preventive care are important in promoting health and wellness. Ask your health care provider about: The right schedule for you to have regular tests and exams. Things you can do on your own to prevent diseases and keep yourself healthy. What should I know about diet, weight, and exercise? Eat a healthy diet  Eat a diet that includes plenty of vegetables, fruits, low-fat dairy products, and lean protein. Do not eat a lot of foods that are high in solid fats, added sugars, or sodium. Maintain a healthy weight Body mass index (BMI) is used to identify weight problems. It estimates body fat based on height and weight. Your health care provider can help determine your BMI and help you achieve or maintain a healthy weight. Get regular exercise Get regular exercise. This is one of the most important things you can do for your health. Most adults should: Exercise for at least 150 minutes each week. The exercise should increase your heart rate and make you sweat (moderate-intensity exercise). Do strengthening exercises at least twice a week. This is in addition to the moderate-intensity exercise. Spend less time sitting. Even light physical activity can be beneficial. Watch cholesterol and blood lipids Have your blood tested for lipids and cholesterol at 38 years of age, then have this test every 5 years. Have your cholesterol levels checked more often if: Your lipid or  cholesterol levels are high. You are older than 38 years of age. You are at high risk for heart disease. What should I know about cancer screening? Depending on your health history and family history, you may need to have cancer screening at various ages. This may include screening for: Breast cancer. Cervical cancer. Colorectal cancer. Skin cancer. Lung cancer. What should I know about heart disease, diabetes, and high blood pressure? Blood pressure and heart disease High blood pressure causes heart disease and increases the risk of stroke. This is more likely to develop in people who have high blood pressure readings or are overweight. Have your blood pressure checked: Every 3-5 years if you are 67-48 years of age. Every year if you are 69 years old or older. Diabetes Have regular diabetes screenings. This checks your fasting blood sugar level. Have the screening done: Once every three years after age 1 if you are at a normal weight and have a low risk for diabetes. More often and at a younger age if you are overweight or have a high risk for diabetes. What should I know about preventing infection? Hepatitis B If you have a higher risk for hepatitis B, you should be screened for this virus. Talk with your health care provider to find out if you are at risk for hepatitis B infection. Hepatitis C Testing is recommended for: Everyone born from [birth year] through 1967. Anyone with known risk factors for hepatitis C. Sexually transmitted infections (STIs) Get screened for STIs, including gonorrhea and chlamydia, if: You are sexually active and are  younger than 38 years of age. You are older than 38 years of age and your health care provider tells you that you are at risk for this type of infection. Your sexual activity has changed since you were last screened, and you are at increased risk for chlamydia or gonorrhea. Ask your health care provider if you are at risk. Ask your health care  provider about whether you are at high risk for HIV. Your health care provider may recommend a prescription medicine to help prevent HIV infection. If you choose to take medicine to prevent HIV, you should first get tested for HIV. You should then be tested every 3 months for as long as you are taking the medicine. Pregnancy If you are about to stop having your period (premenopausal) and you may become pregnant, seek counseling before you get pregnant. Take 400 to 800 micrograms (mcg) of folic acid every day if you become pregnant. Ask for birth control (contraception) if you want to prevent pregnancy. Osteoporosis and menopause Osteoporosis is a disease in which the bones lose minerals and strength with aging. This can result in bone fractures. If you are 72 years old or older, or if you are at risk for osteoporosis and fractures, ask your health care provider if you should: Be screened for bone loss. Take a calcium or vitamin D supplement to lower your risk of fractures. Be given hormone replacement therapy (HRT) to treat symptoms of menopause. Follow these instructions at home: Alcohol use Do not drink alcohol if: Your health care provider tells you not to drink. You are pregnant, may be pregnant, or are planning to become pregnant. If you drink alcohol: Limit how much you have to: 0-1 drink a day. Know how much alcohol is in your drink. In the [place] one drink equals one 12 oz bottle of beer (355 mL), one 5 oz glass of wine (148 mL), or one 1 oz glass of hard liquor (44 mL). Lifestyle Do not use any products that contain nicotine or tobacco. These products include cigarettes, chewing tobacco, and vaping devices, such as e-cigarettes. If you need help quitting, ask your health care provider. Do not use street drugs. Do not share needles. Ask your health care provider for help if you need support or information about quitting drugs. General instructions Schedule regular health, dental, and  eye exams. Stay current with your vaccines. Tell your health care provider if: You often feel depressed. You have ever been abused or do not feel safe at home. Summary Adopting a healthy lifestyle and getting preventive care are important in promoting health and wellness. Follow your health care provider's instructions about healthy diet, exercising, and getting tested or screened for diseases. Follow your health care provider's instructions on monitoring your cholesterol and blood pressure. This information is not intended to replace advice given to you by your health care provider. Make sure you discuss any questions you have with your health care provider. Document Revised: 11/02/2020 Document Reviewed: 11/02/2020 Elsevier Patient Education  [employer].

## 2022-02-04 NOTE — Progress Notes (Signed)
Patient ID: Melinda Castillo, female  DOB: [birth year], 36 y.o.   MRN: [id number] Patient Care Team    Relationship Specialty Notifications Start End  Ma Hillock, DO PCP - General Family Medicine  07/22/20   Haynes Dage, OD  Optometry  07/22/20   Janie Morning, MD  Gastroenterology  07/22/20   Paula Compton, MD Consulting Physician Obstetrics and Gynecology  11/02/21     Chief Complaint  Patient presents with   Annual Exam    Pt is fasting    Subjective:  Melinda Castillo is a 36 y.o.  Female  present for [place] All past medical history, surgical history, allergies, family history, immunizations, medications and social history were updated in the electronic medical record today. All recent labs, ED visits and hospitalizations within the last year were reviewed.  Health maintenance:  Colonoscopy: No family history.  Routine screening at 43  Mammogram: No family history.  Routine screening at 38 Cervical cancer screening: Up-to-date, establish with GYN.  Currently pregnant Immunizations: tdap 2021 UTD, Influenza  (encouraged yearly), PNA series UTD Infectious disease screening: HIV completed, Hep C agreed to testing today DEXA: Routine screening Assistive device: None Oxygen use: None Patient has a Dental home. Hospitalizations/ED visits: Reviewed  Diabetes type 2: Pt reports appliance with metformin 500 ER BID. She did require insulin with pregnancy.Patient denies dizziness, hyperglycemic or hypoglycemic events. Patient denies numbness, tingling in the extremities or nonhealing wounds of feet.  She unfortunately underwent D&C/E secondary to implantation into uterine wall of her 2nd pregnancy. She reports she is currently pregnant with her 3rd pregnancy.  She is following closely with her OB/GYN team.     09/29/2021    2:11 PM 05/03/2021    3:35 PM 07/22/2020    3:17 PM  Depression screen PHQ 2/9  Decreased Interest 0 0 0  Down, Depressed, Hopeless 0 0 0   PHQ - 2 Score 0 0 0       No data to display           Immunization History  Administered Date(s) Administered   HPV Quadrivalent 01/29/2009   Hepatitis B 03/08/2011   Influenza Split 03/28/2013   Influenza, Quadrivalent, Recombinant, Inj, Pf 03/28/2019   Influenza, Seasonal, Injecte, Preservative Fre 03/27/2018, 03/28/2019   Influenza-Unspecified 03/27/2020, 03/27/2021   MMR 05/14/1994   PFIZER(Purple Top)SARS-COV-2 Vaccination 06/29/2019, 07/20/2019, 03/26/2020   Pneumococcal Polysaccharide-23 07/22/2020   Rho (D) Immune Globulin 07/15/2019, 09/26/2019   Tdap 08/02/2019     Past Medical History:  Diagnosis Date   Chronic hypertension affecting pregnancy    Essential hypertension    History of ectopic pregnancy 01/2021   treated with medication   History of positive PPD 2010   per pt skin test, and had negative cxr   History of pregnancy induced hypertension    Infertility, 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 IVF pregnancy through Dr Rolin Barry Day 5 embryo transfer. PGS-A testing completed   Pelvic hematoma, female    S/P dilatation and curettage 03/01/2021   Type 2 diabetes mellitus ([place])    followed by pcp  (06-10-2021  pt stated does not check blood sugar at home)   Wears contact lenses    No Known Allergies Past Surgical History:  Procedure Laterality Date   BREAST REDUCTION SURGERY Bilateral 2013   BREAST SURGERY  06/13   CESAREAN SECTION  09/25/2019   '@[facility]'    COLONOSCOPY WITH ESOPHAGOGASTRODUODENOSCOPY (EGD)  07/2018  r/o celiac- completed for IBS sx. Normal.    DILATATION AND CURETTAGE/HYSTEROSCOPY WITH MINERVA  11/2021   DILATION AND CURETTAGE OF UTERUS N/A 03/01/2021   Procedure: DILATATION AND CURETTAGE WITH ULTRASOUND GUIDANCE;  Surgeon: Governor Specking, MD;  Location: [facility];  Service: Gynecology;  Laterality: N/A;   IR ANGIOGRAM PELVIS SELECTIVE OR SUPRASELECTIVE  03/03/2021   IR ANGIOGRAM SELECTIVE  EACH ADDITIONAL VESSEL  03/03/2021   IR ANGIOGRAM SELECTIVE EACH ADDITIONAL VESSEL  03/03/2021   IR EMBO ART  VEN HEMORR LYMPH EXTRAV  INC GUIDE ROADMAPPING  03/02/2021   IR EMBO ART  VEN HEMORR LYMPH EXTRAV  INC GUIDE ROADMAPPING  03/02/2021   IR US GUIDE VASC ACCESS RIGHT  03/02/2021   ROBOTIC ASSISTED LAPAROSCOPIC LYSIS OF ADHESION N/A 06/16/2021   Procedure: XI ROBOTIC ASSISTED LAPAROSCOPIC REPAIR CESEARAN SECTION SCAR DEFECT, LYSIS OF ADHESIONS;  Surgeon: Governor Specking, MD;  Location: [facility];  Service: Gynecology;  Laterality: N/A;   TONSILLECTOMY  2006   Family History  Problem Relation Age of Onset   Hypertension Mother    Hypertension Father    Ovarian cancer Maternal Grandmother    Early death Maternal Grandmother    Heart disease Maternal Grandfather    Brain cancer Paternal Grandfather    Stroke Paternal Grandmother    Colon cancer Neg Hx    Colon polyps Neg Hx    Social History   Social History Narrative   Marital status/children/pets: married, 1 child.    Education/employment: Statistician, works as Immunologist at [employer].    Safety:      -Wears a bicycle helmet riding a bike: Yes     -smoke alarm in the home:Yes     - wears seatbelt: Yes     - Feels safe in their relationships: Yes    Allergies as of 02/04/2022   No Known Allergies      Medication List        Accurate as of February 04, 2022  8:30 AM. If you have any questions, ask your nurse or doctor.          STOP taking these medications    misoprostol 200 MCG tablet Commonly known as: Cytotec Stopped by: Howard Pouch, DO       TAKE these medications    B-D 3CC LUER-LOK SYR 18GX1-1/2 18G X 1-1/2" 3 ML Misc Generic drug: SYRINGE-NEEDLE (DISP) 3 ML Use as directed   BD Disp Needles 30G X 1/2" Misc Generic drug: NEEDLE (DISP) 30 G Use as directed   Cetrotide 0.25 MG Kit Generic drug: Cetrorelix Acetate Inject 1 syringe into the skin daily or as directed    doxycycline 100 MG capsule Commonly known as: VIBRAMYCIN Take 1 capsule by mouth twice a day   doxycycline 100 MG capsule Commonly known as: VIBRAMYCIN Take 1 capsule by mouth twice daily   doxycycline 100 MG tablet Commonly known as: VIBRA-TABS Take 1 tablet (100 mg total) by mouth 2 (two) times daily.   Easy Touch Hypodermic Needle 22G X 1-1/2" Misc Generic drug: NEEDLE (DISP) 22 G Use as directed   estradiol 2 MG tablet Commonly known as: ESTRACE Take 1 tablet (2 mg total) by mouth 2 (two) times daily.   estradiol 0.1 MG/24HR patch Commonly known as: VIVELLE-DOT Place 1 patch onto the skin every 3 days   Gonal-f 1050 units Solr Generic drug: Follitropin Alfa Inject 375 units into the skin daily as directed   human chorionic gonadotropin 10000 units injection  Commonly known as: PREGNYL/NOVAREL Mix with 26m diluent and inject at exact time given as directed   Novarel 5000 units Solr Generic drug: Chorionic Gonadotropin Reconstitute and inject 10,000 units at time of trigger as directed   Menopur 75 units Solr Generic drug: Menotropins Inject 75 units into the skin daily or as directed   metFORMIN 500 MG 24 hr tablet Commonly known as: GLUCOPHAGE-XR TAKE 1 TABLET BY MOUTH EVERY MORNING AND AT BEDTIME   methylPREDNISolone 8 MG tablet Commonly known as: MEDROL Take 1 tablet (8 mg total) by mouth 2 (two) times daily.   MULTIVITAMIN ADULT PO Take by mouth daily.   progesterone 50 MG/ML injection Inject 1 ml into the muscle daily   ZyrTEC Allergy 10 MG Caps Generic drug: Cetirizine HCl Take 10 mg by mouth daily as needed.        All past medical history, surgical history, allergies, family history, immunizations andmedications were updated in the EMR today and reviewed under the history and medication portions of their EMR.     No results found for this or any previous visit (from the past 2160 hour(s)).  No results found.   ROS 14 pt review of  systems performed and negative (unless mentioned in an HPI)  Objective: BP 127/85   Pulse 66   Temp 97.7 F (36.5 C) (Oral)   Ht '5\' 10"'  (1.778 m)   Wt 252 lb (114.3 kg)   LMP 01/28/2022   SpO2 100%   BMI 36.16 kg/m  Physical Exam Vitals and nursing note reviewed.  Constitutional:      General: She is not in acute distress.    Appearance: Normal appearance. She is not ill-appearing or toxic-appearing.  HENT:     Head: Normocephalic and atraumatic.     Right Ear: Tympanic membrane, ear canal and external ear normal. There is no impacted cerumen.     Left Ear: Tympanic membrane, ear canal and external ear normal. There is no impacted cerumen.     Nose: No congestion or rhinorrhea.     Mouth/Throat:     Mouth: Mucous membranes are moist.     Pharynx: Oropharynx is clear. No oropharyngeal exudate or posterior oropharyngeal erythema.  Eyes:     General: No scleral icterus.       Right eye: No discharge.        Left eye: No discharge.     Extraocular Movements: Extraocular movements intact.     Conjunctiva/sclera: Conjunctivae normal.     Pupils: Pupils are equal, round, and reactive to light.  Cardiovascular:     Rate and Rhythm: Normal rate and regular rhythm.     Pulses: Normal pulses.     Heart sounds: Normal heart sounds. No murmur heard.    No friction rub. No gallop.  Pulmonary:     Effort: Pulmonary effort is normal. No respiratory distress.     Breath sounds: Normal breath sounds. No stridor. No wheezing, rhonchi or rales.  Chest:     Chest wall: No tenderness.  Abdominal:     General: Abdomen is flat. Bowel sounds are normal. There is no distension.     Palpations: Abdomen is soft. There is no mass.     Tenderness: There is no abdominal tenderness. There is no right CVA tenderness, left CVA tenderness, guarding or rebound.     Hernia: No hernia is present.  Musculoskeletal:        General: No swelling, tenderness or deformity. Normal range of motion.  Cervical  back: Normal range of motion and neck supple. No rigidity or tenderness.     Right lower leg: No edema.     Left lower leg: No edema.  Lymphadenopathy:     Cervical: No cervical adenopathy.  Skin:    General: Skin is warm and dry.     Coloration: Skin is not jaundiced or pale.     Findings: No bruising, erythema, lesion or rash.  Neurological:     General: No focal deficit present.     Mental Status: She is alert and oriented to person, place, and time. Mental status is at baseline.     Cranial Nerves: No cranial nerve deficit.     Sensory: No sensory deficit.     Motor: No weakness.     Coordination: Coordination normal.     Gait: Gait normal.     Deep Tendon Reflexes: Reflexes normal.  Psychiatric:        Mood and Affect: Mood normal.        Behavior: Behavior normal.        Thought Content: Thought content normal.        Judgment: Judgment normal.        No results found.  Assessment/plan: Melinda Castillo is a 36 y.o. female present for CPE/CMC Diabetes mellitus type 2 in obese [facility]) Has been stable Continue  metformin to 500 mg BID - diabetic diet - continue  routine exercise.  PNA series: PNA23 UTD Flu shot: UTD (recommneded yearly) Microalb: UTD 07/2021 Foot exam: 11/02/2021 Eye exam: UTD Dr. Domingo Cocking- A1c: 7.5 in October 2021> 6.7>5.9> 5.7> 6.3> collected today - CBC - Comprehensive metabolic panel - Hemoglobin A1c - Lipid panel - TSH  Vitamin D deficiency - VITAMIN D 25 Hydroxy (Vit-D Deficiency, Fractures)  Encounter for hepatitis C screening test for low risk patient - Hepatitis C Antibody Routine general medical examination at a health care facility Colonoscopy: No family history.  Routine screening at 43  Mammogram: No family history.  Routine screening at 38 Cervical cancer screening: Up-to-date, establish with GYN.  Currently pregnant Immunizations: tdap 2021 UTD, Influenza  (encouraged yearly), PNA series UTD Infectious disease screening: HIV  completed, Hep C agreed to testing today DEXA: Routine screening Patient was encouraged to exercise greater than 150 minutes a week. Patient was encouraged to choose a diet filled with fresh fruits and vegetables, and lean meats. AVS provided to patient today for education/recommendation on gender specific health and safety maintenance.  Return in about 24 weeks (around 07/22/2022) for Routine chronic condition follow-up.  Orders Placed This Encounter  Procedures   CBC   Comprehensive metabolic panel   Hemoglobin A1c   Lipid panel   TSH   VITAMIN D 25 Hydroxy (Vit-D Deficiency, Fractures)   Hepatitis C Antibody   Meds ordered this encounter  Medications   metFORMIN (GLUCOPHAGE-XR) 500 MG 24 hr tablet    Sig: TAKE 1 TABLET BY MOUTH EVERY MORNING AND AT BEDTIME    Dispense:  180 tablet    Refill:  1   Referral Orders  No referral(s) requested today     Electronically signed by: Howard Pouch, [place]

## 2022-02-07 ENCOUNTER — Encounter: Payer: 59 | Admitting: Family Medicine

## 2022-02-07 ENCOUNTER — Other Ambulatory Visit (HOSPITAL_COMMUNITY): Payer: Self-pay

## 2022-02-07 ENCOUNTER — Telehealth: Payer: Self-pay | Admitting: Family Medicine

## 2022-02-07 DIAGNOSIS — E288 Other ovarian dysfunction: Secondary | ICD-10-CM | POA: Diagnosis not present

## 2022-02-07 DIAGNOSIS — Z3183 Encounter for assisted reproductive fertility procedure cycle: Secondary | ICD-10-CM | POA: Diagnosis not present

## 2022-02-07 LAB — HEPATITIS C ANTIBODY: Hepatitis C Ab: NONREACTIVE

## 2022-02-07 MED ORDER — METFORMIN HCL ER 750 MG PO TB24
750.0000 mg | ORAL_TABLET | Freq: Two times a day (BID) | ORAL | 1 refills | Status: DC
  Filled 2022-02-07: qty 180, 90d supply, fill #0
  Filled 2022-05-04: qty 180, 90d supply, fill #1

## 2022-02-07 NOTE — Telephone Encounter (Signed)
Please call patient kidney and thyroid function are normal.  Her function is stable.  She had mildly elevated liver enzyme called ALT back in September and it is still mildly elevated.  Usually not concerning at this level but would let her fertility and oncologist know. Blood cell counts and electrolytes are normal A1c has increased from 6.3, now 7.0.  I have increased her metformin XR dose to a 750 mg tab to take twice daily.

## 2022-02-07 NOTE — Telephone Encounter (Signed)
Pt stated that results will determine when to follow. Please advise if pt should still f/u in 6 mos or sooner.

## 2022-02-07 NOTE — Telephone Encounter (Signed)
Spoke with pt regarding labs and instructions.   

## 2022-02-07 NOTE — Telephone Encounter (Signed)
Discussed that if she becomes pregnant she will follow-up from then on with her OB team.  She is currently undergoing fertility treatments.  Otherwise, she can follow-up here in 6 months

## 2022-02-08 ENCOUNTER — Other Ambulatory Visit (HOSPITAL_COMMUNITY): Payer: Self-pay

## 2022-02-08 NOTE — Telephone Encounter (Signed)
Pt will cb to 6 mo f/u

## 2022-02-09 ENCOUNTER — Other Ambulatory Visit (HOSPITAL_COMMUNITY): Payer: Self-pay

## 2022-02-09 DIAGNOSIS — Z3183 Encounter for assisted reproductive fertility procedure cycle: Secondary | ICD-10-CM | POA: Diagnosis not present

## 2022-02-10 ENCOUNTER — Other Ambulatory Visit (HOSPITAL_COMMUNITY): Payer: Self-pay

## 2022-02-10 DIAGNOSIS — E289 Ovarian dysfunction, unspecified: Secondary | ICD-10-CM | POA: Diagnosis not present

## 2022-02-12 DIAGNOSIS — Z3183 Encounter for assisted reproductive fertility procedure cycle: Secondary | ICD-10-CM | POA: Diagnosis not present

## 2022-02-13 DIAGNOSIS — Z3183 Encounter for assisted reproductive fertility procedure cycle: Secondary | ICD-10-CM | POA: Diagnosis not present

## 2022-03-01 DIAGNOSIS — Z01419 Encounter for gynecological examination (general) (routine) without abnormal findings: Secondary | ICD-10-CM | POA: Diagnosis not present

## 2022-03-01 DIAGNOSIS — Z1389 Encounter for screening for other disorder: Secondary | ICD-10-CM | POA: Diagnosis not present

## 2022-03-01 DIAGNOSIS — Z130 Encounter for screening for diseases of the blood and blood-forming organs and certain disorders involving the immune mechanism: Secondary | ICD-10-CM | POA: Diagnosis not present

## 2022-03-28 ENCOUNTER — Other Ambulatory Visit (HOSPITAL_COMMUNITY): Payer: Self-pay

## 2022-03-28 MED ORDER — CLOMID 50 MG PO TABS
50.0000 mg | ORAL_TABLET | Freq: Every day | ORAL | 0 refills | Status: AC
  Filled 2022-03-28: qty 5, 5d supply, fill #0

## 2022-03-31 ENCOUNTER — Other Ambulatory Visit (HOSPITAL_COMMUNITY): Payer: Self-pay

## 2022-04-01 ENCOUNTER — Telehealth: Payer: 59 | Admitting: Physician Assistant

## 2022-04-01 DIAGNOSIS — L304 Erythema intertrigo: Secondary | ICD-10-CM

## 2022-04-01 MED ORDER — FLUCONAZOLE 150 MG PO TABS
150.0000 mg | ORAL_TABLET | ORAL | 0 refills | Status: DC | PRN
Start: 2022-04-01 — End: 2022-04-27

## 2022-04-01 MED ORDER — NYSTATIN 100000 UNIT/GM EX CREA: 1.0000 | TOPICAL_CREAM | Freq: Two times a day (BID) | CUTANEOUS | 0 refills | Status: DC

## 2022-04-01 NOTE — Patient Instructions (Signed)
Melinda Castillo, thank you for joining Mar Daring, PA-C for today's virtual visit.  While this provider is not your primary care provider (PCP), if your PCP is located in our provider database this encounter information will be shared with them immediately following your visit.  Consent: (Patient) Melinda Castillo provided verbal consent for this virtual visit at the beginning of the encounter.  Current Medications:  Current Outpatient Medications:    fluconazole (DIFLUCAN) 150 MG tablet, Take 1 tablet (150 mg total) by mouth every 3 (three) days as needed., Disp: 2 tablet, Rfl: 0   nystatin cream (MYCOSTATIN), Apply 1 Application topically 2 (two) times daily., Disp: 60 g, Rfl: 0   Cetirizine HCl (ZYRTEC ALLERGY) 10 MG CAPS, Take 10 mg by mouth daily as needed., Disp: , Rfl:    Cetrorelix Acetate 0.25 MG KIT, Inject 1 syringe into the skin daily or as directed, Disp: 5 kit, Rfl: 2   Chorionic Gonadotropin (NOVAREL) 5000 units SOLR, Reconstitute and inject 10,000 units at time of trigger as directed, Disp: 2 each, Rfl: 0   clomiPHENE (CLOMID) 50 MG tablet, Take 1 tablet (50 mg total) by mouth daily for 5 days., Disp: 5 tablet, Rfl: 0   doxycycline (VIBRA-TABS) 100 MG tablet, Take 1 tablet (100 mg total) by mouth 2 (two) times daily., Disp: 40 tablet, Rfl: 0   doxycycline (VIBRAMYCIN) 100 MG capsule, Take 1 capsule by mouth twice a day, Disp: 10 capsule, Rfl: 0   doxycycline (VIBRAMYCIN) 100 MG capsule, Take 1 capsule by mouth twice daily, Disp: 10 capsule, Rfl: 0   estradiol (ESTRACE) 2 MG tablet, Take 1 tablet (2 mg total) by mouth 2 (two) times daily., Disp: 60 tablet, Rfl: 3   estradiol (VIVELLE-DOT) 0.1 MG/24HR patch, Place 1 patch onto the skin every 3 days, Disp: 8 patch, Rfl: 1   Follitropin Alfa (GONAL-F) 1050 units SOLR, Inject 375 units into the skin daily as directed, Disp: 3 each, Rfl: 2   human chorionic gonadotropin (PREGNYL/NOVAREL) 10000 units injection, Mix with 43m  diluent and inject at exact time given as directed, Disp: 1 each, Rfl: 0   Menotropins (MENOPUR) 75 units SOLR, Inject 75 units into the skin daily or as directed, Disp: 10 each, Rfl: 2   metFORMIN (GLUCOPHAGE-XR) 750 MG 24 hr tablet, Take 1 tablet (750 mg total) by mouth 2 (two) times daily., Disp: 180 tablet, Rfl: 1   methylPREDNISolone (MEDROL) 8 MG tablet, Take 1 tablet (8 mg total) by mouth 2 (two) times daily., Disp: 8 tablet, Rfl: 1   Multiple Vitamin (MULTIVITAMIN ADULT PO), Take by mouth daily., Disp: , Rfl:    NEEDLE, DISP, 22 G (EASY TOUCH HYPODERMIC NEEDLE) 22G X 1-1/2" MISC, Use as directed, Disp: 60 each, Rfl: 1   NEEDLE, DISP, 30 G (BD DISP NEEDLES) 30G X 1/2" MISC, Use as directed, Disp: 20 each, Rfl: 1   progesterone 50 MG/ML injection, Inject 1 ml into the muscle daily, Disp: 30 mL, Rfl: 1   SYRINGE-NEEDLE, DISP, 3 ML (B-D 3CC LUER-LOK SYR 18GX1-1/2) 18G X 1-1/2" 3 ML MISC, Use as directed, Disp: 60 each, Rfl: 1   Medications ordered in this encounter:  Meds ordered this encounter  Medications   nystatin cream (MYCOSTATIN)    Sig: Apply 1 Application topically 2 (two) times daily.    Dispense:  60 g    Refill:  0    Order Specific Question:   Supervising Provider    Answer:   Melinda Castillo[  [id number]]   fluconazole (DIFLUCAN) 150 MG tablet    Sig: Take 1 tablet (150 mg total) by mouth every 3 (three) days as needed.    Dispense:  2 tablet    Refill:  0    Order Specific Question:   Supervising Provider    Answer:   Melinda Castillo [id number]     *If you need refills on other medications prior to your next appointment, please contact your pharmacy*  Follow-Up: Call back or seek an in-person evaluation if the symptoms worsen or if the condition fails to improve as anticipated.  [place] [phone]  Other Instructions  Intertrigo [name] is skin irritation or inflammation (dermatitis) that occurs when folds of skin rub together. The  irritation can cause a rash and make skin raw and itchy. This condition most commonly occurs in the skin folds of these areas: Toes. Armpits. Groin. Under the belly. Under the breasts. Buttocks. Intertrigo is not passed from person to person (is not contagious). What are the causes? This condition is caused by heat, moisture, rubbing (friction), and not enough air circulation. The condition can be made worse by: Sweat. Bacteria. A fungus, such as yeast. What increases the risk? This condition is more likely to occur if you have moisture in your skin folds. You are more likely to develop this condition if you: Have diabetes. Are overweight. Are not able to move around or are not active. Live in a warm and moist climate. Wear splints, braces, or other medical devices. Are not able to control your bowels or bladder (have incontinence). What are the signs or symptoms? Symptoms of this condition include: A pink or red skin rash in the skin fold or near the skin fold. Raw or scaly skin. Itchiness. A burning feeling. Bleeding. Leaking fluid. A bad smell. How is this diagnosed? This condition is diagnosed with a medical history and physical exam. You may also have a skin swab to test for bacteria or a fungus. How is this treated? This condition may be treated by: Cleaning and drying your skin. Taking an antibiotic medicine or using an antibiotic skin cream for a bacterial infection. Using an antifungal cream on your skin or taking pills for an infection that was caused by a fungus, such as yeast. Using a steroid ointment to relieve itchiness and irritation. Separating the skin fold with a clean cotton cloth to absorb moisture and allow air to flow into the area. Follow these instructions at home: Keep the affected area clean and dry. Do not scratch your skin. Stay in a cool environment as much as possible. Use an air conditioner or fan, if available. Apply over-the-counter and  prescription medicines only as told by your health care provider. If you were prescribed an antibiotic medicine, use it as told by your health care provider. Do not stop using the antibiotic even if your condition improves. Keep all follow-up visits as told by your health care provider. This is important. How is this prevented?  Maintain a healthy weight. Take care of your feet, especially if you have diabetes. Foot care includes: Wearing shoes that fit well. Keeping your feet dry. Wearing clean, breathable socks. Protect the skin around your groin and buttocks, especially if you have incontinence. Skin protection includes: Following a regular cleaning routine. Using skin protectant creams, powders, or ointments. Changing protection pads frequently. Do not wear tight clothes. Wear clothes that are loose, absorbent, and made of cotton. Wear a bra that gives good  support, if needed. Shower and dry yourself well after activity or exercise. Use a hair dryer on a cool setting to dry between skin folds, especially after you bathe. If you have diabetes, keep your blood sugar under control. Contact a health care provider if: Your symptoms do not improve with treatment. Your symptoms get worse or they spread. You notice increased redness and warmth. You have a fever. Summary Intertrigo is skin irritation or inflammation (dermatitis) that occurs when folds of skin rub together. This condition is caused by heat, moisture, rubbing (friction), and not enough air circulation. This condition may be treated by cleaning and drying your skin and with medicines. Apply over-the-counter and prescription medicines only as told by your health care provider. Keep all follow-up visits as told by your health care provider. This is important. This information is not intended to replace advice given to you by your health care provider. Make sure you discuss any questions you have with your health care  provider. Document Revised: 08/25/2021 Document Reviewed: 03/29/2021 Elsevier Patient Education  [place].    If you have been instructed to have an in-person evaluation today at a local Urgent Care facility, please use the link below. It will take you to a list of all of our available [facility], including address, phone number and hours of operation. Please do not delay care.  [facility]  If you or a family member do not have a primary care provider, use the link below to schedule a visit and establish care. When you choose a [facility] primary care physician or advanced practice provider, you gain a long-term partner in health. Find a Primary Care Provider  Learn more about [facility]'s in-office and virtual care options: [facility]

## 2022-04-01 NOTE — Progress Notes (Signed)
Virtual Visit Consent   Melinda Castillo, you are scheduled for a virtual visit with a [facility] provider today. Just as with appointments in the office, your consent must be obtained to participate. Your consent will be active for this visit and any virtual visit you may have with one of our providers in the next 365 days. If you have a MyChart account, a copy of this consent can be sent to you electronically.  As this is a virtual visit, video technology does not allow for your provider to perform a traditional examination. This may limit your provider's ability to fully assess your condition. If your provider identifies any concerns that need to be evaluated in person or the need to arrange testing (such as labs, EKG, etc.), we will make arrangements to do so. Although advances in technology are sophisticated, we cannot ensure that it will always work on either your end or our end. If the connection with a video visit is poor, the visit may have to be switched to a telephone visit. With either a video or telephone visit, we are not always able to ensure that we have a secure connection.  By engaging in this virtual visit, you consent to the provision of healthcare and authorize for your insurance to be billed (if applicable) for the services provided during this visit. Depending on your insurance coverage, you may receive a charge related to this service.  I need to obtain your verbal consent now. Are you willing to proceed with your visit today? Lei Dower has provided verbal consent on 04/01/2022 for a virtual visit (video or telephone). Mar Daring, PA-C  Date: 04/01/2022 3:34 PM  Virtual Visit via Video Note   I, Mar Daring, connected with  [name]  ([id number], Mar 08, 1986) on 04/01/22 at  3:30 PM EDT by a video-enabled telemedicine application and verified that I am speaking with the correct person using two identifiers.  Location: Patient: Virtual Visit  Location Patient: Mobile Provider: Virtual Visit Location Provider: Home Office   I discussed the limitations of evaluation and management by telemedicine and the availability of in person appointments. The patient expressed understanding and agreed to proceed.    History of Present Illness: Melinda Castillo is a 36 y.o. who identifies as a female who was assigned female at birth, and is being seen today for rash.  HPI: Rash This is a new problem. The current episode started in the past 7 days (Monday). The problem has been gradually worsening since onset. The affected locations include the left axilla. The rash is characterized by burning, itchiness and redness. Associated with: new scrubs. Pertinent negatives include no congestion, cough, fatigue, rhinorrhea or shortness of breath. Treatments tried: topical over the counter anti fungal. The treatment provided no relief.     Problems:  Patient Active Problem List   Diagnosis Date Noted   Diabetes mellitus type 2 in obese ([place]) 11/02/2016   Vitamin D deficiency 08/22/2011    Allergies: No Known Allergies Medications:  Current Outpatient Medications:    fluconazole (DIFLUCAN) 150 MG tablet, Take 1 tablet (150 mg total) by mouth every 3 (three) days as needed., Disp: 2 tablet, Rfl: 0   nystatin cream (MYCOSTATIN), Apply 1 Application topically 2 (two) times daily., Disp: 60 g, Rfl: 0   Cetirizine HCl (ZYRTEC ALLERGY) 10 MG CAPS, Take 10 mg by mouth daily as needed., Disp: , Rfl:    Cetrorelix Acetate 0.25 MG KIT, Inject 1 syringe into the skin daily or  as directed, Disp: 5 kit, Rfl: 2   Chorionic Gonadotropin (NOVAREL) 5000 units SOLR, Reconstitute and inject 10,000 units at time of trigger as directed, Disp: 2 each, Rfl: 0   clomiPHENE (CLOMID) 50 MG tablet, Take 1 tablet (50 mg total) by mouth daily for 5 days., Disp: 5 tablet, Rfl: 0   doxycycline (VIBRA-TABS) 100 MG tablet, Take 1 tablet (100 mg total) by mouth 2 (two) times daily.,  Disp: 40 tablet, Rfl: 0   doxycycline (VIBRAMYCIN) 100 MG capsule, Take 1 capsule by mouth twice a day, Disp: 10 capsule, Rfl: 0   doxycycline (VIBRAMYCIN) 100 MG capsule, Take 1 capsule by mouth twice daily, Disp: 10 capsule, Rfl: 0   estradiol (ESTRACE) 2 MG tablet, Take 1 tablet (2 mg total) by mouth 2 (two) times daily., Disp: 60 tablet, Rfl: 3   estradiol (VIVELLE-DOT) 0.1 MG/24HR patch, Place 1 patch onto the skin every 3 days, Disp: 8 patch, Rfl: 1   Follitropin Alfa (GONAL-F) 1050 units SOLR, Inject 375 units into the skin daily as directed, Disp: 3 each, Rfl: 2   human chorionic gonadotropin (PREGNYL/NOVAREL) 10000 units injection, Mix with 61m diluent and inject at exact time given as directed, Disp: 1 each, Rfl: 0   Menotropins (MENOPUR) 75 units SOLR, Inject 75 units into the skin daily or as directed, Disp: 10 each, Rfl: 2   metFORMIN (GLUCOPHAGE-XR) 750 MG 24 hr tablet, Take 1 tablet (750 mg total) by mouth 2 (two) times daily., Disp: 180 tablet, Rfl: 1   methylPREDNISolone (MEDROL) 8 MG tablet, Take 1 tablet (8 mg total) by mouth 2 (two) times daily., Disp: 8 tablet, Rfl: 1   Multiple Vitamin (MULTIVITAMIN ADULT PO), Take by mouth daily., Disp: , Rfl:    NEEDLE, DISP, 22 G (EASY TOUCH HYPODERMIC NEEDLE) 22G X 1-1/2" MISC, Use as directed, Disp: 60 each, Rfl: 1   NEEDLE, DISP, 30 G (BD DISP NEEDLES) 30G X 1/2" MISC, Use as directed, Disp: 20 each, Rfl: 1   progesterone 50 MG/ML injection, Inject 1 ml into the muscle daily, Disp: 30 mL, Rfl: 1   SYRINGE-NEEDLE, DISP, 3 ML (B-D 3CC LUER-LOK SYR 18GX1-1/2) 18G X 1-1/2" 3 ML MISC, Use as directed, Disp: 60 each, Rfl: 1  Observations/Objective: Patient is well-developed, well-nourished in no acute distress.  Resting comfortably  Head is normocephalic, atraumatic.  No labored breathing.  Speech is clear and coherent with logical content.  Patient is alert and oriented at baseline.    Assessment and Plan: 1. Intertrigo - nystatin  cream (MYCOSTATIN); Apply 1 Application topically 2 (two) times daily.  Dispense: 60 g; Refill: 0 - fluconazole (DIFLUCAN) 150 MG tablet; Take 1 tablet (150 mg total) by mouth every 3 (three) days as needed.  Dispense: 2 tablet; Refill: 0  - Nystatin and Fluconazole prescribed - Keep area clean and dry - Limit antiperspirant or deodorant use until clear - Loose fitting clothing to avoid rubbing - Seek in person evaluation if fails to improve or if symptoms worsen  Follow Up Instructions: I discussed the assessment and treatment plan with the patient. The patient was provided an opportunity to ask questions and all were answered. The patient agreed with the plan and demonstrated an understanding of the instructions.  A copy of instructions were sent to the patient via MyChart unless otherwise noted below.    The patient was advised to call back or seek an in-person evaluation if the symptoms worsen or if the condition fails to improve as  anticipated.  Time:  I spent 8 minutes with the patient via telehealth technology discussing the above problems/concerns.    Mar Daring, PA-C

## 2022-04-04 ENCOUNTER — Other Ambulatory Visit (HOSPITAL_COMMUNITY)
Admission: RE | Admit: 2022-04-04 | Discharge: 2022-04-04 | Disposition: A | Discharge status: Home / Self Care | Payer: 59 | Source: Ambulatory Visit

## 2022-04-04 ENCOUNTER — Ambulatory Visit (INDEPENDENT_AMBULATORY_CARE_PROVIDER_SITE_OTHER): Payer: 59 | Admitting: General Surgery

## 2022-04-04 ENCOUNTER — Other Ambulatory Visit (HOSPITAL_COMMUNITY)
Admission: RE | Admit: 2022-04-04 | Discharge: 2022-04-04 | Disposition: A | Discharge status: Home / Self Care | Payer: 59 | Source: Ambulatory Visit | Attending: General Surgery | Admitting: General Surgery

## 2022-04-04 ENCOUNTER — Ambulatory Visit (HOSPITAL_COMMUNITY)
Admission: RE | Admit: 2022-04-04 | Discharge: 2022-04-04 | Disposition: A | Discharge status: Home / Self Care | Payer: 59 | Source: Ambulatory Visit | Attending: General Surgery | Admitting: General Surgery

## 2022-04-04 ENCOUNTER — Encounter: Payer: Self-pay | Admitting: General Surgery

## 2022-04-04 VITALS — BP 130/84 | HR 85 | Temp 98.2°F | Resp 16 | Ht 70.0 in | Wt 240.0 lb

## 2022-04-04 DIAGNOSIS — R1011 Right upper quadrant pain: Secondary | ICD-10-CM

## 2022-04-04 DIAGNOSIS — R1013 Epigastric pain: Secondary | ICD-10-CM | POA: Diagnosis not present

## 2022-04-04 DIAGNOSIS — R112 Nausea with vomiting, unspecified: Secondary | ICD-10-CM | POA: Diagnosis not present

## 2022-04-04 DIAGNOSIS — E559 Vitamin D deficiency, unspecified: Secondary | ICD-10-CM

## 2022-04-04 HISTORY — DX: Nausea with vomiting, unspecified: R11.2

## 2022-04-04 LAB — COMPREHENSIVE METABOLIC PANEL
ALT: 44 U/L (ref 0–44)
AST: 26 U/L (ref 15–41)
Albumin: 4.7 g/dL (ref 3.5–5.0)
Alkaline Phosphatase: 42 U/L (ref 38–126)
Anion gap: 9 (ref 5–15)
BUN: 12 mg/dL (ref 6–20)
CO2: 25 mmol/L (ref 22–32)
Calcium: 9.2 mg/dL (ref 8.9–10.3)
Chloride: 104 mmol/L (ref 98–111)
Creatinine, Ser: 0.55 mg/dL (ref 0.44–1.00)
GFR, Estimated: >60 mL/min (ref >60)
Glucose, Bld: 111 mg/dL — ABNORMAL HIGH (ref 70–99)
Potassium: 4.5 mmol/L (ref 3.5–5.1)
Sodium: 138 mmol/L (ref 135–145)
Total Bilirubin: 0.5 mg/dL (ref 0.3–1.2)
Total Protein: 8 g/dL (ref 6.5–8.1)

## 2022-04-04 LAB — CBC WITH DIFFERENTIAL/PLATELET
Abs Immature Granulocytes: 0.02 10*3/uL (ref 0.00–0.07)
Basophils Absolute: 0 10*3/uL (ref 0.0–0.1)
Basophils Relative: 0 %
Eosinophils Absolute: 0.1 10*3/uL (ref 0.0–0.5)
Eosinophils Relative: 1 %
HCT: 43.1 % (ref 36.0–46.0)
Hemoglobin: 14.1 g/dL (ref 12.0–15.0)
Immature Granulocytes: 0 %
Lymphocytes Relative: 32 %
Lymphs Abs: 1.9 10*3/uL (ref 0.7–4.0)
MCH: 29.4 pg (ref 26.0–34.0)
MCHC: 32.7 g/dL (ref 30.0–36.0)
MCV: 89.8 fL (ref 80.0–100.0)
Monocytes Absolute: 0.4 10*3/uL (ref 0.1–1.0)
Monocytes Relative: 7 %
Neutro Abs: 3.5 10*3/uL (ref 1.7–7.7)
Neutrophils Relative %: 60 %
Platelets: 224 10*3/uL (ref 150–400)
RBC: 4.8 MIL/uL (ref 3.87–5.11)
RDW: 12.3 % (ref 11.5–15.5)
WBC: 5.9 10*3/uL (ref 4.0–10.5)
nRBC: 0 % (ref 0.0–0.2)

## 2022-04-04 NOTE — Progress Notes (Signed)
Notified patient of all lab work and [place] results. She will keep a food diary to see if has the issue again.

## 2022-04-04 NOTE — Patient Instructions (Addendum)
Will get [place] and labs to see what is going on. Will call with results.     Minimally Invasive Cholecystectomy Minimally invasive cholecystectomy is surgery to remove the gallbladder. The gallbladder is a pear-shaped organ that lies beneath the liver on the right side of the body. The gallbladder stores bile, which is a fluid that helps the body digest fats. Cholecystectomy is often done to treat inflammation (irritation and swelling) of the gallbladder (cholecystitis). This condition is usually caused by a buildup of gallstones (cholelithiasis) in the gallbladder or when the fluid in the gall bladder becomes stagnant because gallstones get stuck in the ducts (tubes) and block the flow of bile. This can result in inflammation and pain. In severe cases, emergency surgery may be required. This procedure is done through small incisions in the abdomen, instead of one large incision. It is also called laparoscopic surgery. A thin scope with a camera (laparoscope) is inserted through one incision. Then surgical instruments are inserted through the other incisions. In some cases, a minimally invasive surgery may need to be changed to a surgery that is done through a larger incision. This is called open surgery. Tell a health care provider about: Any allergies you have. All medicines you are taking, including vitamins, herbs, eye drops, creams, and over-the-counter medicines. Any problems you or family members have had with anesthetic medicines. Any bleeding problems you have. Any surgeries you have had. Any medical conditions you have. Whether you are pregnant or may be pregnant. What are the risks? Generally, this is a safe procedure. However, problems may occur, including: Infection. Bleeding. Allergic reactions to medicines. Damage to nearby structures or organs. A gallstone remaining in the common bile duct. The common bile duct carries bile from the gallbladder to the small intestine. A bile leak  from the liver or cystic duct after your gallbladder is removed. What happens before the procedure? Medicines Ask your health care provider about: Changing or stopping your regular medicines. This is especially important if you are taking diabetes medicines or blood thinners. Taking medicines such as aspirin and ibuprofen. These medicines can thin your blood. Do not take these medicines unless your health care provider tells you to take them. Taking over-the-counter medicines, vitamins, herbs, and supplements. General instructions If you will be going home right after the procedure, plan to have a responsible adult: Take you home from the hospital or clinic. You will not be allowed to drive. Care for you for the time you are told. Do not use any products that contain nicotine or tobacco for at least 4 weeks before the procedure. These products include cigarettes, chewing tobacco, and vaping devices, such as e-cigarettes. If you need help quitting, ask your health care provider. Ask your health care provider: How your surgery site will be marked. What steps will be taken to help prevent infection. These may include: Removing hair at the surgery site. Washing skin with a germ-killing soap. Taking antibiotic medicine. What happens during the procedure?  An IV will be inserted into one of your veins. You will be given one or both of the following: A medicine to help you relax (sedative). A medicine to make you fall asleep (general anesthetic). Your surgeon will make several small incisions in your abdomen. The laparoscope will be inserted through one of the small incisions. The camera on the laparoscope will send images to a monitor in the operating room. This lets your surgeon see inside your abdomen. A gas will be pumped into your  abdomen. This will expand your abdomen to give the surgeon more room to perform the surgery. Other tools that are needed for the procedure will be inserted through  the other incisions. The gallbladder will be removed through one of the incisions. Your common bile duct may be examined. If stones are found in the common bile duct, they may be removed. After your gallbladder has been removed, the incisions will be closed with stitches (sutures), staples, or skin glue. Your incisions will be covered with a bandage (dressing). The procedure may vary among health care providers and hospitals. What happens after the procedure? Your blood pressure, heart rate, breathing rate, and blood oxygen level will be monitored until you leave the hospital or clinic. You will be given medicines as needed to control your pain. You may have a drain placed in the incision. The drain will be removed a day or two after the procedure. Summary Minimally invasive cholecystectomy, also called laparoscopic cholecystectomy, is surgery to remove the gallbladder using small incisions. Tell your health care provider about all the medical conditions you have and all the medicines you are taking for those conditions. Before the procedure, follow instructions about when to stop eating and drinking and changing or stopping medicines. Plan to have a responsible adult care for you for the time you are told after you leave the hospital or clinic. This information is not intended to replace advice given to you by your health care provider. Make sure you discuss any questions you have with your health care provider. Document Revised: 12/15/2020 Document Reviewed: 12/15/2020 Elsevier Patient Education  [place]. Cholelithiasis  Cholelithiasis is a disease in which gallstones form in the gallbladder. The gallbladder is an organ that stores bile. Bile is a fluid that helps to digest fats. Gallstones begin as small crystals and can slowly grow into stones. They may cause no symptoms until they block the gallbladder duct, or cystic duct, when the gallbladder tightens (contracts) after food is  eaten. This can cause pain and is known as a gallbladder attack, or biliary colic. There are two main types of gallstones: Cholesterol stones. These are the most common type of gallstone. These stones are made of hardened cholesterol and are usually yellow-green in color. Cholesterol is a fat-like substance that is made in the liver. Pigment stones. These are dark in color and are made of a red-yellow substance, called bilirubin,that forms when hemoglobin from red blood cells breaks down. What are the causes? This condition may be caused by an imbalance in the different parts that make bile. This can happen if the bile: Has too much bilirubin. This can happen in certain blood diseases, such as sickle cell anemia. Has too much cholesterol. Does not have enough bile salts. These salts help the body absorb and digest fats. In some cases, this condition can also be caused by the gallbladder not emptying completely or often enough. This is common during pregnancy. What increases the risk? The following factors may make you more likely to develop this condition: Being female. Having multiple pregnancies. Health care providers sometimes advise removing diseased gallbladders before future pregnancies. Eating a diet that is heavy in fried foods, fat, and refined carbohydrates, such as white bread and white rice. Being obese. Being older than age 16. Using medicines that contain female hormones (estrogen) for a long time. Losing weight quickly. Having a family history of gallstones. Having certain medical problems, such as: Diabetes mellitus. Cystic fibrosis. Crohn's disease. Cirrhosis or other long-term (  chronic) liver disease. Certain blood diseases, such as sickle cell anemia or leukemia. What are the signs or symptoms? In many cases, having gallstones causes no symptoms. When you have gallstones but do not have symptoms, you have silent gallstones. If a gallstone blocks your bile duct, it can  cause a gallbladder attack. The main symptom of a gallbladder attack is sudden pain in the upper right part of the abdomen. The pain: Usually comes at night or after eating. Can last for one hour or more. Can spread to your right shoulder, back, or chest. Can feel like indigestion. This is discomfort, burning, or fullness in your upper abdomen. If the bile duct is blocked for more than a few hours, it can cause an infection or inflammation of your gallbladder (cholecystitis), liver, or pancreas. This can cause: Nausea or vomiting. Bloating. Pain in your abdomen that lasts for 5 hours or longer. Tenderness in your upper abdomen, often in the upper right section and under your rib cage. Fever or chills. Skin or the white parts of your eyes turning yellow (jaundice). This usually happens when a stone has blocked bile from passing through the common bile duct. Dark urine or light-colored stools. How is this diagnosed? This condition may be diagnosed based on: A physical exam. Your medical history. Ultrasound. CT scan. MRI. You may also have other tests, including: Blood tests to check for signs of an infection or inflammation. Cholescintigraphy, or HIDA scan. This is a scan of your gallbladder and bile ducts (biliary system) using non-harmful radioactive material and special cameras that can see the radioactive material. Endoscopic retrograde cholangiopancreatogram. This involves inserting a small tube with a camera on the end (endoscope) through your mouth to look at bile ducts and check for blockages. How is this treated? Treatment for this condition depends on the severity of the condition. Silent gallstones do not need treatment. Treatment may be needed if a blockage causes a gallbladder attack or other symptoms. Treatment may include: Home care, if symptoms are not severe. During a simple gallbladder attack, stop eating and drinking for 12-24 hours (except for water and clear liquids).  This helps to "cool down" your gallbladder. After 1 or 2 days, you can start to eat a diet of simple or clear foods, such as broths and crackers. You may also need medicines for pain or nausea or both. If you have cholecystitis and an infection, you will need antibiotics. A hospital stay, if needed for pain control or for cholecystitis with severe infection. Cholecystectomy, or surgery to remove your gallbladder. This is the most common treatment if all other treatments have not worked. Medicines to break up gallstones. These are most effective at treating small gallstones. Medicines may be used for up to 6-12 months. Endoscopic retrograde cholangiopancreatogram. A small basket can be attached to the endoscope and used to capture and remove gallstones, mainly those that are in the common bile duct. Follow these instructions at home: Medicines Take over-the-counter and prescription medicines only as told by your health care provider. If you were prescribed an antibiotic medicine, take it as told by your health care provider. Do not stop taking the antibiotic even if you start to feel better. Ask your health care provider if the medicine prescribed to you requires you to avoid driving or using machinery. Eating and drinking Drink enough fluid to keep your urine pale yellow. This is important during a gallbladder attack. Water and clear liquids are preferred. Follow a healthy diet. This includes: Reducing  fatty foods, such as fried food and foods high in cholesterol. Reducing refined carbohydrates, such as white bread and white rice. Eating more fiber. Aim for foods such as almonds, fruit, and beans. Alcohol use If you drink alcohol: Limit how much you use to: 0-1 drink a day for nonpregnant women. 0-2 drinks a day for men. Be aware of how much alcohol is in your drink. In the [place] one drink equals one 12 oz bottle of beer (355 mL), one 5 oz glass of wine (148 mL), or one 1 oz glass of hard  liquor (44 mL). General instructions Do not use any products that contain nicotine or tobacco, such as cigarettes, e-cigarettes, and chewing tobacco. If you need help quitting, ask your health care provider. Maintain a healthy weight. Keep all follow-up visits as told by your health care provider. These may include consultations with a surgeon or specialist. This is important. Where to find more information [employer] Diabetes and Digestive and Kidney Diseases: [email or web address] Contact a health care provider if: You think you have had a gallbladder attack. You have been diagnosed with silent gallstones and you develop pain in your abdomen or indigestion. You begin to have attacks more often. You have dark urine or light-colored stools. Get help right away if: You have pain from a gallbladder attack that lasts for more than 2 hours. You have pain in your abdomen that lasts for more than 5 hours or is getting worse. You have a fever or chills. You have nausea and vomiting that do not go away. You develop jaundice. Summary Cholelithiasis is a disease in which gallstones form in the gallbladder. This condition may be caused by an imbalance in the different parts that make bile. This can happen if your bile has too much bilirubin or cholesterol, or does not have enough bile salts. Treatment for gallstones depends on the severity of the condition. Silent gallstones do not need treatment. If gallstones cause a gallbladder attack or other symptoms, treatment usually involves not eating or drinking anything. Treatment may also include pain medicines and antibiotics, and it sometimes includes a hospital stay. Surgery to remove the gallbladder is common if all other treatments have not worked. This information is not intended to replace advice given to you by your health care provider. Make sure you discuss any questions you have with your health care provider. Document Revised: 05/06/2019  Document Reviewed: 05/06/2019 Elsevier Patient Education  2023 [employer] Biliary Colic, Adult  Biliary colic is severe pain caused by a problem with the gallbladder. The gallbladder is a small organ in the upper right part of the abdomen. The gallbladder stores a digestive fluid produced in the liver (bile) that helps the body break down fat. Bile and other digestive enzymes are carried from the liver to the small intestine through tube-like structures called bile ducts. The gallbladder and the bile ducts form the biliary tract. Sometimes, hard deposits of digestive fluids (gallstones) form in the gallbladder and block the flow of bile from the gallbladder, causing biliary colic. This condition is also called a gallbladder attack. Gallstones can be as small as a grain of sand or as big as a golf ball. There could be just one gallstone in the gallbladder, or there could be many. What are the causes? This condition is usually caused by gallstones. Less often, a tumor could block the flow of bile from the gallbladder and trigger biliary colic. What increases the risk? The following factors may make you  more likely to develop this condition: Being female. Having a family history of gallstones. Being obese. Losing weight suddenly or quickly. Eating a diet that is high in calories, low in fiber, and rich in refined carbohydrates, such as white bread and white rice. Having certain health conditions, such as: An intestinal disease that affects nutrient absorption, such as Crohn's disease. A metabolic condition, such as diabetes or metabolic syndrome. Metabolic syndrome occurs when someone has high blood pressure, high cholesterol, and diabetes. A blood condition, such as hemolytic anemia or sickle cell disease. What are the signs or symptoms? The main symptom of this condition is severe pain in the upper right side of the abdomen. You may feel this pain below the chest but above the hip. This pain  often occurs at night or after eating a meal that is high in fat. This pain may get worse for up to an hour and last as long as 12 hours. In most cases, the pain fades (subsides) within 2 hours. Other symptoms of this condition include: Nausea and vomiting. Pain under the right shoulder. How is this diagnosed? This condition is diagnosed based on your medical history, your symptoms, and a physical exam. You may also have tests, including: Blood tests to rule out infection or inflammation of the bile ducts, gallbladder, pancreas, or liver. Imaging studies, such as: An ultrasound. A CT scan. An MRI. In some cases, you may need to have an imaging study done using a small amount of radioactive material (nuclear medicine) to confirm the diagnosis. How is this treated? This condition may be treated with medicines to: Relieve your pain or nausea. Dissolve the gallstones. It may take months or years before the gallstones are completely gone. If you have gallstones, or if you have a tumor in the gallbladder that is causing biliary colic, you may need surgery to remove the gallbladder (cholecystectomy). Follow these instructions at home: Eating and drinking Drink enough fluid to keep your urine pale yellow. Follow instructions from your health care provider about eating or drinking restrictions. These may include avoiding: Fatty, greasy, and fried foods. Any foods that make the pain worse. Overeating. Having a large meal after not eating for a while. General instructions Take over-the-counter and prescription medicines only as told by your health care provider. Keep all follow-up visits as told by your health care provider. This is important. How is this prevented? Steps to prevent this condition include: Maintaining a healthy body weight. Getting regular exercise. Eating a healthy diet that is high in fiber and low in fat. Limiting how much sugar and refined carbohydrates you eat. Contact a  health care provider if: Your pain lasts more than 5 hours. You vomit. You have a fever and chills. Your pain gets worse. Get help right away if: Your skin or the whites of your eyes look yellow (jaundice). Your have tea-colored urine and light-colored stools (feces). You are dizzy or you faint. Summary Biliary colic is severe pain caused by a problem with the gallbladder. The gallbladder is a small organ in the upper right part of your abdomen. Treatment for this condition may include medicine to relieve your pain or nausea, or medicine to slowly dissolve the gallstones. If you have gallstones, or if you have a tumor in the gallbladder that is causing biliary colic, you may need surgery to remove the gallbladder (cholecystectomy). This information is not intended to replace advice given to you by your health care provider. Make sure you discuss any questions  you have with your health care provider. Document Revised: 06/25/2019 Document Reviewed: 04/16/2019 Elsevier Patient Education  2023 [employer].

## 2022-04-04 NOTE — Progress Notes (Signed)
[facility] History and Physical  Reason for Referral: RUQ pain, nausea and vomiting   Melinda Castillo is a 35 y.o. female.  HPI: Melinda Castillo is a well known to me and had Ruq pain that came on all of a sudden last night and was associated with nausea and vomiting. She has continued to have the pain this am. She reports that she has never had pain like this prior. She had a BLT around 2pm. She has taken some zofran to help with the nausea.  Past Medical History:  Diagnosis Date   Chronic hypertension affecting pregnancy    Essential hypertension    History of ectopic pregnancy 01/2021   treated with medication   History of positive PPD 2010   per pt skin test, and had negative cxr   History of pregnancy induced hypertension    Infertility, 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 IVF pregnancy through Dr Deaton Day 5 embryo transfer. PGS-A testing completed   Pelvic hematoma, female    S/P dilatation and curettage 03/01/2021   Type 2 diabetes mellitus (HCC)    followed by pcp  (06-10-2021  pt stated does not check blood sugar at home)   Wears contact lenses     Past Surgical History:  Procedure Laterality Date   BREAST REDUCTION SURGERY Bilateral 2013   BREAST SURGERY  06/13   CESAREAN SECTION  09/25/2019   @[facility]   COLONOSCOPY WITH ESOPHAGOGASTRODUODENOSCOPY (EGD)  07/2018   r/o celiac- completed for IBS sx. Normal.    DILATATION AND CURETTAGE/HYSTEROSCOPY WITH MINERVA  11/2021   DILATION AND CURETTAGE OF UTERUS N/A 03/01/2021   Procedure: DILATATION AND CURETTAGE WITH ULTRASOUND GUIDANCE;  Surgeon: Yalcinkaya, Tamer, MD;  Location: [facility] OR;  Service: Gynecology;  Laterality: N/A;   IR ANGIOGRAM PELVIS SELECTIVE OR SUPRASELECTIVE  03/03/2021   IR ANGIOGRAM SELECTIVE EACH ADDITIONAL VESSEL  03/03/2021   IR ANGIOGRAM SELECTIVE EACH ADDITIONAL VESSEL  03/03/2021   IR EMBO ART  VEN HEMORR LYMPH EXTRAV  INC GUIDE  ROADMAPPING  03/02/2021   IR EMBO ART  VEN HEMORR LYMPH EXTRAV  INC GUIDE ROADMAPPING  03/02/2021   IR US GUIDE VASC ACCESS RIGHT  03/02/2021   ROBOTIC ASSISTED LAPAROSCOPIC LYSIS OF ADHESION N/A 06/16/2021   Procedure: XI ROBOTIC ASSISTED LAPAROSCOPIC REPAIR CESEARAN SECTION SCAR DEFECT, LYSIS OF ADHESIONS;  Surgeon: Yalcinkaya, Tamer, MD;  Location: [facility];  Service: Gynecology;  Laterality: N/A;   TONSILLECTOMY  2006    Family History  Problem Relation Age of Onset   Hypertension Mother    Hypertension Father    Ovarian cancer Maternal Grandmother    Early death Maternal Grandmother    Heart disease Maternal Grandfather    Brain cancer Paternal Grandfather    Stroke Paternal Grandmother    Colon cancer Neg Hx    Colon polyps Neg Hx     Social History   Tobacco Use   Smoking status: Never   Smokeless tobacco: Never  Vaping Use   Vaping Use: Never used  Substance Use Topics   Alcohol use: Yes    Alcohol/week: 1.0 standard drink of alcohol    Types: 1 Glasses of wine per week    Comment: social   Drug use: Never    Medications: I have reviewed the patient's current medications. Allergies as of 04/04/2022   No Known Allergies      Medication List          Accurate as of April 04, 2022  9:41 AM. If you have any questions, ask your nurse or doctor.          STOP taking these medications    B-D 3CC LUER-LOK SYR 18GX1-1/2 18G X 1-1/2" 3 ML Misc Generic drug: SYRINGE-NEEDLE (DISP) 3 ML Stopped by: Chamberlain Steinborn C Jadalyn Oliveri, MD   BD Disp Needles 30G X 1/2" Misc Generic drug: NEEDLE (DISP) 30 G Stopped by: Maicey Barrientez C Ravina Milner, MD   Cetrotide 0.25 MG Kit Generic drug: Cetrorelix Acetate Stopped by: Savanah Bayles C Gurley Climer, MD   doxycycline 100 MG capsule Commonly known as: VIBRAMYCIN Stopped by: Raysa Bosak C Arshad Oberholzer, MD   doxycycline 100 MG tablet Commonly known as: VIBRA-TABS Stopped by: Sakira Dahmer C Bliss Behnke, MD   Easy Touch Hypodermic Needle 22G X 1-1/2"  Misc Generic drug: NEEDLE (DISP) 22 G Stopped by: Chinmay Squier C Yoneko Talerico, MD   estradiol 0.1 MG/24HR patch Commonly known as: VIVELLE-DOT Stopped by: Jolie Strohecker C Torryn Fiske, MD   estradiol 2 MG tablet Commonly known as: ESTRACE Stopped by: Britiany Silbernagel C Matias Thurman, MD   Gonal-f 1050 units Solr Generic drug: Follitropin Alfa Stopped by: Tanina Barb C Aurora Rody, MD   human chorionic gonadotropin 10000 units injection Commonly known as: PREGNYL/NOVAREL Stopped by: Aslynn Brunetti C Oliviya Gilkison, MD   Menopur 75 units Solr Generic drug: Menotropins Stopped by: Dylan Monforte C Kassem Kibbe, MD   methylPREDNISolone 8 MG tablet Commonly known as: MEDROL Stopped by: Edelin Fryer C Atiyah Bauer, MD   Novarel 5000 units Solr Generic drug: Chorionic Gonadotropin Stopped by: Aysel Gilchrest C Naja Apperson, MD   progesterone 50 MG/ML injection Stopped by: Caidence Kaseman C Pershing Skidmore, MD       TAKE these medications    fluconazole 150 MG tablet Commonly known as: DIFLUCAN Take 1 tablet (150 mg total) by mouth every 3 (three) days as needed.   metFORMIN 750 MG 24 hr tablet Commonly known as: GLUCOPHAGE-XR Take 1 tablet (750 mg total) by mouth 2 (two) times daily.   MULTIVITAMIN ADULT PO Take by mouth daily.   nystatin cream Commonly known as: MYCOSTATIN Apply 1 Application topically 2 (two) times daily.   ZyrTEC Allergy 10 MG Caps Generic drug: Cetirizine HCl Take 10 mg by mouth daily as needed.         ROS:  A comprehensive review of systems was negative except for: Gastrointestinal: positive for abdominal pain, nausea, and vomiting  Blood pressure 130/84, pulse 85, temperature 98.2 F (36.8 C), resp. rate 16, height 5' 10" (1.778 m), weight 240 lb (108.9 kg), SpO2 98 %, unknown if currently breastfeeding. Physical Exam Vitals reviewed.  HENT:     Head: Normocephalic.     Nose: Nose normal.  Eyes:     Extraocular Movements: Extraocular movements intact.  Cardiovascular:     Rate and Rhythm: Normal rate.  Pulmonary:     Effort:  Pulmonary effort is normal.  Abdominal:     General: There is no distension.     Palpations: Abdomen is soft.     Tenderness: There is abdominal tenderness in the right upper quadrant.  Musculoskeletal:        General: Normal range of motion.     Cervical back: Normal range of motion.  Skin:    General: Skin is warm.  Neurological:     General: No focal deficit present.     Mental Status: She is alert and oriented to person, place, and time.  Psychiatric:        Mood and Affect: Mood normal.          Behavior: Behavior normal.     Results: None   Assessment & Plan:  Javaria Matar is a 35 y.o. female with RUQ pain and associated nausea and vomiting that is sounding like biliary colic given the acute onset and persistent pain. She had not had pain like this before. She ate a BLT in the afternoon.   -US to see if she has gallstones or other etiology  -Will also order labs. -Will talk about surgery once we have this information   All questions were answered to the satisfaction of the patient.      Stepheni Cameron C Zafira Munos 04/04/2022, 9:41 AM       

## 2022-04-04 NOTE — H&P (View-Only) (Signed)
[facility] History and Physical  Reason for Referral: RUQ pain, nausea and vomiting   Melinda Castillo is a 36 y.o. female.  HPI: Melinda Castillo is a well known to me and had Ruq pain that came on all of a sudden last night and was associated with nausea and vomiting. She has continued to have the pain this am. She reports that she has never had pain like this prior. She had a BLT around 2pm. She has taken some zofran to help with the nausea.  Past Medical History:  Diagnosis Date   Chronic hypertension affecting pregnancy    Essential hypertension    History of ectopic pregnancy 01/2021   treated with medication   History of positive PPD 2010   per pt skin test, and had negative cxr   History of pregnancy induced hypertension    Infertility, 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 IVF pregnancy through Dr Rolin Barry Day 5 embryo transfer. PGS-A testing completed   Pelvic hematoma, female    S/P dilatation and curettage 03/01/2021   Type 2 diabetes mellitus ([place])    followed by pcp  (06-10-2021  pt stated does not check blood sugar at home)   Wears contact lenses     Past Surgical History:  Procedure Laterality Date   BREAST REDUCTION SURGERY Bilateral 2013   BREAST SURGERY  06/13   CESAREAN SECTION  09/25/2019   _0    COLONOSCOPY WITH ESOPHAGOGASTRODUODENOSCOPY (EGD)  07/2018   r/o celiac- completed for IBS sx. Normal.    DILATATION AND CURETTAGE/HYSTEROSCOPY WITH MINERVA  11/2021   DILATION AND CURETTAGE OF UTERUS N/A 03/01/2021   Procedure: DILATATION AND CURETTAGE WITH ULTRASOUND GUIDANCE;  Surgeon: Governor Specking, MD;  Location: [place];  Service: Gynecology;  Laterality: N/A;   IR ANGIOGRAM PELVIS SELECTIVE OR SUPRASELECTIVE  03/03/2021   IR ANGIOGRAM SELECTIVE EACH ADDITIONAL VESSEL  03/03/2021   IR ANGIOGRAM SELECTIVE EACH ADDITIONAL VESSEL  03/03/2021   IR EMBO ART  VEN HEMORR LYMPH EXTRAV  INC GUIDE  ROADMAPPING  03/02/2021   IR EMBO ART  VEN HEMORR LYMPH EXTRAV  INC GUIDE ROADMAPPING  03/02/2021   IR US GUIDE VASC ACCESS RIGHT  03/02/2021   ROBOTIC ASSISTED LAPAROSCOPIC LYSIS OF ADHESION N/A 06/16/2021   Procedure: XI ROBOTIC ASSISTED LAPAROSCOPIC REPAIR CESEARAN SECTION SCAR DEFECT, LYSIS OF ADHESIONS;  Surgeon: Governor Specking, MD;  Location: [facility];  Service: Gynecology;  Laterality: N/A;   TONSILLECTOMY  2006    Family History  Problem Relation Age of Onset   Hypertension Mother    Hypertension Father    Ovarian cancer Maternal Grandmother    Early death Maternal Grandmother    Heart disease Maternal Grandfather    Brain cancer Paternal Grandfather    Stroke Paternal Grandmother    Colon cancer Neg Hx    Colon polyps Neg Hx     Social History   Tobacco Use   Smoking status: Never   Smokeless tobacco: Never  Vaping Use   Vaping Use: Never used  Substance Use Topics   Alcohol use: Yes    Alcohol/week: 1.0 standard drink of alcohol    Types: 1 Glasses of wine per week    Comment: social   Drug use: Never    Medications: I have reviewed the patient's current medications. Allergies as of 04/04/2022   No Known Allergies      Medication List  Accurate as of April 04, 2022  9:41 AM. If you have any questions, ask your nurse or doctor.          STOP taking these medications    B-D 3CC LUER-LOK SYR 18GX1-1/2 18G X 1-1/2" 3 ML Misc Generic drug: SYRINGE-NEEDLE (DISP) 3 ML Stopped by: Virl Cagey, MD   BD Disp Needles 30G X 1/2" Misc Generic drug: NEEDLE (DISP) 30 G Stopped by: Virl Cagey, MD   Cetrotide 0.25 MG Kit Generic drug: Cetrorelix Acetate Stopped by: Virl Cagey, MD   doxycycline 100 MG capsule Commonly known as: VIBRAMYCIN Stopped by: Virl Cagey, MD   doxycycline 100 MG tablet Commonly known as: VIBRA-TABS Stopped by: Virl Cagey, MD   Easy Touch Hypodermic Needle 22G X 1-1/2"  Misc Generic drug: NEEDLE (DISP) 22 G Stopped by: Virl Cagey, MD   estradiol 0.1 MG/24HR patch Commonly known as: VIVELLE-DOT Stopped by: Virl Cagey, MD   estradiol 2 MG tablet Commonly known as: ESTRACE Stopped by: Virl Cagey, MD   Gonal-f 1050 units Solr Generic drug: Follitropin Alfa Stopped by: Virl Cagey, MD   human chorionic gonadotropin 10000 units injection Commonly known as: PREGNYL/NOVAREL Stopped by: Virl Cagey, MD   Menopur 75 units Solr Generic drug: Menotropins Stopped by: Virl Cagey, MD   methylPREDNISolone 8 MG tablet Commonly known as: MEDROL Stopped by: Virl Cagey, MD   Novarel 5000 units Solr Generic drug: Chorionic Gonadotropin Stopped by: Virl Cagey, MD   progesterone 50 MG/ML injection Stopped by: Virl Cagey, MD       TAKE these medications    fluconazole 150 MG tablet Commonly known as: DIFLUCAN Take 1 tablet (150 mg total) by mouth every 3 (three) days as needed.   metFORMIN 750 MG 24 hr tablet Commonly known as: GLUCOPHAGE-XR Take 1 tablet (750 mg total) by mouth 2 (two) times daily.   MULTIVITAMIN ADULT PO Take by mouth daily.   nystatin cream Commonly known as: MYCOSTATIN Apply 1 Application topically 2 (two) times daily.   ZyrTEC Allergy 10 MG Caps Generic drug: Cetirizine HCl Take 10 mg by mouth daily as needed.         ROS:  A comprehensive review of systems was negative except for: Gastrointestinal: positive for abdominal pain, nausea, and vomiting  Blood pressure 130/84, pulse 85, temperature 98.2 F (36.8 C), resp. rate 16, height _0  (1.778 m), weight 240 lb (108.9 kg), SpO2 98 %, unknown if currently breastfeeding. Physical Exam Vitals reviewed.  HENT:     Head: Normocephalic.     Nose: Nose normal.  Eyes:     Extraocular Movements: Extraocular movements intact.  Cardiovascular:     Rate and Rhythm: Normal rate.  Pulmonary:     Effort:  Pulmonary effort is normal.  Abdominal:     General: There is no distension.     Palpations: Abdomen is soft.     Tenderness: There is abdominal tenderness in the right upper quadrant.  Musculoskeletal:        General: Normal range of motion.     Cervical back: Normal range of motion.  Skin:    General: Skin is warm.  Neurological:     General: No focal deficit present.     Mental Status: She is alert and oriented to person, place, and time.  Psychiatric:        Mood and Affect: Mood normal.  Behavior: Behavior normal.     Results: None   Assessment & Plan:  Di Jasmer is a 36 y.o. female with RUQ pain and associated nausea and vomiting that is sounding like biliary colic given the acute onset and persistent pain. She had not had pain like this before. She ate a BLT in the afternoon.   -[place] to see if she has gallstones or other etiology  -Will also order labs. -Will talk about surgery once we have this information   All questions were answered to the satisfaction of the patient.      Virl Cagey 04/04/2022, 9:41 AM

## 2022-04-08 ENCOUNTER — Ambulatory Visit: Payer: 59 | Admitting: Family Medicine

## 2022-04-08 ENCOUNTER — Telehealth (INDEPENDENT_AMBULATORY_CARE_PROVIDER_SITE_OTHER): Payer: 59 | Admitting: General Surgery

## 2022-04-08 ENCOUNTER — Encounter (HOSPITAL_COMMUNITY)
Admission: RE | Admit: 2022-04-08 | Discharge: 2022-04-08 | Disposition: A | Discharge status: Home / Self Care | Payer: 59 | Source: Ambulatory Visit | Attending: General Surgery | Admitting: General Surgery

## 2022-04-08 ENCOUNTER — Other Ambulatory Visit (HOSPITAL_COMMUNITY): Payer: Self-pay

## 2022-04-08 DIAGNOSIS — K811 Chronic cholecystitis: Secondary | ICD-10-CM | POA: Insufficient documentation

## 2022-04-08 DIAGNOSIS — Z01818 Encounter for other preprocedural examination: Secondary | ICD-10-CM

## 2022-04-08 DIAGNOSIS — Z3202 Encounter for pregnancy test, result negative: Secondary | ICD-10-CM | POA: Diagnosis not present

## 2022-04-08 LAB — POCT PREGNANCY, URINE: Preg Test, Ur: NEGATIVE

## 2022-04-08 MED ORDER — PROMETHAZINE HCL 12.5 MG PO TABS
12.5000 mg | ORAL_TABLET | Freq: Four times a day (QID) | ORAL | 0 refills | Status: DC | PRN
  Filled 2022-04-08: qty 30, 8d supply, fill #0

## 2022-04-08 NOTE — Telephone Encounter (Signed)
[facility]  Still with RUQ pain and nausea and vomiting. Had chicken and broccoli last night. Had been better for a while.  Discussed potential HIDA versus lap chol for chronic cholecystitis/ biliary colic symptoms.   Patient wants to proceed with laparoscopic cholecystectomy given her symptoms and the severity.  Will see if we can get her posted for Monday 10/16.  Curlene Labrum, MD [facility] [address] [place], [place] [address] [phone] (office)

## 2022-04-08 NOTE — Patient Instructions (Signed)
Melinda Castillo  04/08/2022     @PREFPERIOPPHARMACY @   Your procedure is scheduled on  04/11/2022.   Report to [facility] at  1000 A.M.   Call this number if you have problems the morning of surgery:  [phone]  If you experience any cold or flu symptoms such as cough, fever, chills, shortness of breath, etc. between now and your scheduled surgery, please notify us at the above number.   Remember:  Do not eat or drink after midnight.         DO NOT take any medications for diabetes the morning of your procedure.     Take these medicines the morning of surgery with A SIP OF WATER                                                  zyrtec.     Do not wear jewelry, make-up or nail polish.  Do not wear lotions, powders, or perfumes, or deodorant.  Do not shave 48 hours prior to surgery.  Men may shave face and neck.  Do not bring valuables to the hospital.  [facility] is not responsible for any belongings or valuables.  Contacts, dentures or bridgework may not be worn into surgery.  Leave your suitcase in the car.  After surgery it may be brought to your room.  For patients admitted to the hospital, discharge time will be determined by your treatment team.  Patients discharged the day of surgery will not be allowed to drive home and must have someone with them for 24 hours.    Special instructions:   DO NOT smoke tobacco or vape for 24 hours before your procedure.  Please read over the following fact sheets that you were given. Coughing and Deep Breathing, Surgical Site Infection Prevention, Anesthesia Post-op Instructions, and Care and Recovery After Surgery      Minimally Invasive Cholecystectomy, Care After The following information offers guidance on how to care for yourself after your procedure. Your health care provider may also give you more specific instructions. If you have problems or questions, contact your health care provider. What can I  expect after the procedure? After the procedure, it is common to have: Pain at your incision sites. You will be given medicines to control this pain. Mild nausea or vomiting. Bloating and possible shoulder pain from the gas that was used during the procedure. Follow these instructions at home: Medicines Take over-the-counter and prescription medicines only as told by your health care provider. If you were prescribed an antibiotic medicine, take it as told by your health care provider. Do not stop using the antibiotic even if you start to feel better. Ask your health care provider if the medicine prescribed to you: Requires you to avoid driving or using machinery. Can cause constipation. You may need to take these actions to prevent or treat constipation: Drink enough fluid to keep your urine pale yellow. Take over-the-counter or prescription medicines. Eat foods that are high in fiber, such as beans, whole grains, and fresh fruits and vegetables. Limit foods that are high in fat and processed sugars, such as fried or sweet foods. Incision care  Follow instructions from your health care provider about how to take care of your incisions. Make sure you: Wash your hands with soap and  water for at least 20 seconds before and after you change your bandage (dressing). If soap and water are not available, use hand sanitizer. Change your dressing as told by your health care provider. Leave stitches (sutures), skin glue, or adhesive strips in place. These skin closures may need to be in place for 2 weeks or longer. If adhesive strip edges start to loosen and curl up, you may trim the loose edges. Do not remove adhesive strips completely unless your health care provider tells you to do that. Do not take baths, swim, or use a hot tub until your health care provider approves. Ask your health care provider if you may take showers. You may only be allowed to take sponge baths. Check your incision area every  day for signs of infection. Check for: More redness, swelling, or pain. Fluid or blood. Warmth. Pus or a bad smell. Activity Rest as told by your health care provider. Do not do activities that require a lot of effort. Avoid sitting for a long time without moving. Get up to take short walks every 1-2 hours. This is important to improve blood flow and breathing. Ask for help if you feel weak or unsteady. Do not lift anything that is heavier than 10 lb (4.5 kg), or the limit that you are told, until your health care provider says that it is safe. Do not play contact sports until your health care provider approves. Do not return to work or school until your health care provider approves. Return to your normal activities as told by your health care provider. Ask your health care provider what activities are safe for you. General instructions If you were given a sedative during the procedure, it can affect you for several hours. Do not drive or operate machinery until your health care provider says that it is safe. Keep all follow-up visits. This is important. Contact a health care provider if: You develop a rash. You have more redness, swelling, or pain around your incisions. You have fluid or blood coming from your incisions. Your incisions feel warm to the touch. You have pus or a bad smell coming from your incisions. You have a fever. One or more of your incisions breaks open. Get help right away if: You have trouble breathing. You have chest pain. You have more pain in your shoulders. You faint or feel dizzy when you stand. You have severe pain in your abdomen. You have nausea or vomiting that lasts for more than one day. You have leg pain that is new or unusual, or if it is localized to one specific spot. These symptoms may represent a serious problem that is an emergency. Do not wait to see if the symptoms will go away. Get medical help right away. Call your local emergency services  (911 in the [place] Do not drive yourself to the hospital. Summary After your procedure, it is common to have pain at the incision sites. You may also have nausea or bloating. Follow your health care provider's instructions about medicine, activity restrictions, and caring for your incision areas. Do not do activities that require a lot of effort. Contact a health care provider if you have a fever or other signs of infection, such as more redness, swelling, or pain around the incisions. Get help right away if you have chest pain, increasing pain in the shoulders, or trouble breathing. This information is not intended to replace advice given to you by your health care provider. Make sure you discuss  any questions you have with your health care provider. Document Revised: 12/15/2020 Document Reviewed: 12/15/2020 Elsevier Patient Education  [place] Anesthesia, Adult, Care After The following information offers guidance on how to care for yourself after your procedure. Your health care provider may also give you more specific instructions. If you have problems or questions, contact your health care provider. What can I expect after the procedure? After the procedure, it is common for people to: Have pain or discomfort at the IV site. Have nausea or vomiting. Have a sore throat or hoarseness. Have trouble concentrating. Feel cold or chills. Feel weak, sleepy, or tired (fatigue). Have soreness and body aches. These can affect parts of the body that were not involved in surgery. Follow these instructions at home: For the time period you were told by your health care provider:  Rest. Do not participate in activities where you could fall or become injured. Do not drive or use machinery. Do not drink alcohol. Do not take sleeping pills or medicines that cause drowsiness. Do not make important decisions or sign legal documents. Do not take care of children on your own. General  instructions Drink enough fluid to keep your urine pale yellow. If you have sleep apnea, surgery and certain medicines can increase your risk for breathing problems. Follow instructions from your health care provider about wearing your sleep device: Anytime you are sleeping, including during daytime naps. While taking prescription pain medicines, sleeping medicines, or medicines that make you drowsy. Return to your normal activities as told by your health care provider. Ask your health care provider what activities are safe for you. Take over-the-counter and prescription medicines only as told by your health care provider. Do not use any products that contain nicotine or tobacco. These products include cigarettes, chewing tobacco, and vaping devices, such as e-cigarettes. These can delay incision healing after surgery. If you need help quitting, ask your health care provider. Contact a health care provider if: You have nausea or vomiting that does not get better with medicine. You vomit every time you eat or drink. You have pain that does not get better with medicine. You cannot urinate or have bloody urine. You develop a skin rash. You have a fever. Get help right away if: You have trouble breathing. You have chest pain. You vomit blood. These symptoms may be an emergency. Get help right away. Call 911. Do not wait to see if the symptoms will go away. Do not drive yourself to the hospital. Summary After the procedure, it is common to have a sore throat, hoarseness, nausea, vomiting, or to feel weak, sleepy, or fatigue. For the time period you were told by your health care provider, do not drive or use machinery. Get help right away if you have difficulty breathing, have chest pain, or vomit blood. These symptoms may be an emergency. This information is not intended to replace advice given to you by your health care provider. Make sure you discuss any questions you have with your health  care provider. Document Revised: 09/10/2021 Document Reviewed: 09/10/2021 Elsevier Patient Education  [place]. How to Use Chlorhexidine Before Surgery Chlorhexidine gluconate (CHG) is a germ-killing (antiseptic) solution that is used to clean the skin. It can get rid of the bacteria that normally live on the skin and can keep them away for about 24 hours. To clean your skin with CHG, you may be given: A CHG solution to use in the shower or as part of  a sponge bath. A prepackaged cloth that contains CHG. Cleaning your skin with CHG may help lower the risk for infection: While you are staying in the intensive care unit of the hospital. If you have a vascular access, such as a central line, to provide short-term or long-term access to your veins. If you have a catheter to drain urine from your bladder. If you are on a ventilator. A ventilator is a machine that helps you breathe by moving air in and out of your lungs. After surgery. What are the risks? Risks of using CHG include: A skin reaction. Hearing loss, if CHG gets in your ears and you have a perforated eardrum. Eye injury, if CHG gets in your eyes and is not rinsed out. The CHG product catching fire. Make sure that you avoid smoking and flames after applying CHG to your skin. Do not use CHG: If you have a chlorhexidine allergy or have previously reacted to chlorhexidine. On babies younger than 62 months of age. How to use CHG solution Use CHG only as told by your health care provider, and follow the instructions on the label. Use the full amount of CHG as directed. Usually, this is one bottle. During a shower Follow these steps when using CHG solution during a shower (unless your health care provider gives you different instructions): Start the shower. Use your normal soap and shampoo to wash your face and hair. Turn off the shower or move out of the shower stream. Pour the CHG onto a clean washcloth. Do not use any type  of brush or rough-edged sponge. Starting at your neck, lather your body down to your toes. Make sure you follow these instructions: If you will be having surgery, pay special attention to the part of your body where you will be having surgery. Scrub this area for at least 1 minute. Do not use CHG on your head or face. If the solution gets into your ears or eyes, rinse them well with water. Avoid your genital area. Avoid any areas of skin that have broken skin, cuts, or scrapes. Scrub your back and under your arms. Make sure to wash skin folds. Let the lather sit on your skin for 1-2 minutes or as long as told by your health care provider. Thoroughly rinse your entire body in the shower. Make sure that all body creases and crevices are rinsed well. Dry off with a clean towel. Do not put any substances on your body afterward--such as powder, lotion, or perfume--unless you are told to do so by your health care provider. Only use lotions that are recommended by the manufacturer. Put on clean clothes or pajamas. If it is the night before your surgery, sleep in clean sheets.  During a sponge bath Follow these steps when using CHG solution during a sponge bath (unless your health care provider gives you different instructions): Use your normal soap and shampoo to wash your face and hair. Pour the CHG onto a clean washcloth. Starting at your neck, lather your body down to your toes. Make sure you follow these instructions: If you will be having surgery, pay special attention to the part of your body where you will be having surgery. Scrub this area for at least 1 minute. Do not use CHG on your head or face. If the solution gets into your ears or eyes, rinse them well with water. Avoid your genital area. Avoid any areas of skin that have broken skin, cuts, or scrapes. Scrub your back  and under your arms. Make sure to wash skin folds. Let the lather sit on your skin for 1-2 minutes or as long as told by  your health care provider. Using a different clean, wet washcloth, thoroughly rinse your entire body. Make sure that all body creases and crevices are rinsed well. Dry off with a clean towel. Do not put any substances on your body afterward--such as powder, lotion, or perfume--unless you are told to do so by your health care provider. Only use lotions that are recommended by the manufacturer. Put on clean clothes or pajamas. If it is the night before your surgery, sleep in clean sheets. How to use CHG prepackaged cloths Only use CHG cloths as told by your health care provider, and follow the instructions on the label. Use the CHG cloth on clean, dry skin. Do not use the CHG cloth on your head or face unless your health care provider tells you to. When washing with the CHG cloth: Avoid your genital area. Avoid any areas of skin that have broken skin, cuts, or scrapes. Before surgery Follow these steps when using a CHG cloth to clean before surgery (unless your health care provider gives you different instructions): Using the CHG cloth, vigorously scrub the part of your body where you will be having surgery. Scrub using a back-and-forth motion for 3 minutes. The area on your body should be completely wet with CHG when you are done scrubbing. Do not rinse. Discard the cloth and let the area air-dry. Do not put any substances on the area afterward, such as powder, lotion, or perfume. Put on clean clothes or pajamas. If it is the night before your surgery, sleep in clean sheets.  For general bathing Follow these steps when using CHG cloths for general bathing (unless your health care provider gives you different instructions). Use a separate CHG cloth for each area of your body. Make sure you wash between any folds of skin and between your fingers and toes. Wash your body in the following order, switching to a new cloth after each step: The front of your neck, shoulders, and chest. Both of your  arms, under your arms, and your hands. Your stomach and groin area, avoiding the genitals. Your right leg and foot. Your left leg and foot. The back of your neck, your back, and your buttocks. Do not rinse. Discard the cloth and let the area air-dry. Do not put any substances on your body afterward--such as powder, lotion, or perfume--unless you are told to do so by your health care provider. Only use lotions that are recommended by the manufacturer. Put on clean clothes or pajamas. Contact a health care provider if: Your skin gets irritated after scrubbing. You have questions about using your solution or cloth. You swallow any chlorhexidine. Call your local poison control center (1-(518) 284-1044 in the [place] Get help right away if: Your eyes itch badly, or they become very red or swollen. Your skin itches badly and is red or swollen. Your hearing changes. You have trouble seeing. You have swelling or tingling in your mouth or throat. You have trouble breathing. These symptoms may represent a serious problem that is an emergency. Do not wait to see if the symptoms will go away. Get medical help right away. Call your local emergency services (911 in the [place] Do not drive yourself to the hospital. Summary Chlorhexidine gluconate (CHG) is a germ-killing (antiseptic) solution that is used to clean the skin. Cleaning your skin with CHG may  help to lower your risk for infection. You may be given CHG to use for bathing. It may be in a bottle or in a prepackaged cloth to use on your skin. Carefully follow your health care provider's instructions and the instructions on the product label. Do not use CHG if you have a chlorhexidine allergy. Contact your health care provider if your skin gets irritated after scrubbing. This information is not intended to replace advice given to you by your health care provider. Make sure you discuss any questions you have with your health care provider. Document  Revised: 10/11/2021 Document Reviewed: 08/24/2020 Elsevier Patient Education  [place].

## 2022-04-08 NOTE — Telephone Encounter (Signed)
LAPAROSCOPIC CHOLECYSTECTOMY- Outpatient Procedure Date: 04/11/2022~ 12:05 pm- 1:30 pm Pre- Op Date: 04/08/2022- telephone call  CPT 47562 Dx: K81.1/ K80.5 [facility]~ Care Management [phone]- [phone]~ telephone Representative: Earlyne Iba  No Authorization Required  [employer], LPN/ RSA

## 2022-04-11 ENCOUNTER — Telehealth: Payer: Self-pay | Admitting: Family Medicine

## 2022-04-11 ENCOUNTER — Ambulatory Visit (HOSPITAL_COMMUNITY): Payer: 59 | Admitting: Anesthesiology

## 2022-04-11 ENCOUNTER — Other Ambulatory Visit: Payer: Self-pay

## 2022-04-11 ENCOUNTER — Encounter (HOSPITAL_COMMUNITY): Payer: Self-pay | Admitting: General Surgery

## 2022-04-11 ENCOUNTER — Ambulatory Visit (HOSPITAL_COMMUNITY)
Admission: RE | Admit: 2022-04-11 | Discharge: 2022-04-11 | Disposition: A | Discharge status: Home / Self Care | Payer: 59 | Source: Ambulatory Visit | Attending: General Surgery | Admitting: General Surgery

## 2022-04-11 ENCOUNTER — Encounter (HOSPITAL_COMMUNITY)
Admission: RE | Disposition: A | Discharge status: Home / Self Care | Payer: Self-pay | Source: Ambulatory Visit | Attending: General Surgery

## 2022-04-11 ENCOUNTER — Ambulatory Visit (HOSPITAL_BASED_OUTPATIENT_CLINIC_OR_DEPARTMENT_OTHER): Payer: 59 | Admitting: Anesthesiology

## 2022-04-11 DIAGNOSIS — K811 Chronic cholecystitis: Secondary | ICD-10-CM | POA: Diagnosis not present

## 2022-04-11 DIAGNOSIS — Z01818 Encounter for other preprocedural examination: Secondary | ICD-10-CM

## 2022-04-11 DIAGNOSIS — E119 Type 2 diabetes mellitus without complications: Secondary | ICD-10-CM | POA: Diagnosis not present

## 2022-04-11 DIAGNOSIS — Z6833 Body mass index (BMI) 33.0-33.9, adult: Secondary | ICD-10-CM | POA: Diagnosis not present

## 2022-04-11 DIAGNOSIS — E669 Obesity, unspecified: Secondary | ICD-10-CM | POA: Insufficient documentation

## 2022-04-11 DIAGNOSIS — I10 Essential (primary) hypertension: Secondary | ICD-10-CM | POA: Insufficient documentation

## 2022-04-11 DIAGNOSIS — Z7984 Long term (current) use of oral hypoglycemic drugs: Secondary | ICD-10-CM | POA: Insufficient documentation

## 2022-04-11 DIAGNOSIS — K8010 Calculus of gallbladder with chronic cholecystitis without obstruction: Secondary | ICD-10-CM | POA: Insufficient documentation

## 2022-04-11 DIAGNOSIS — K8044 Calculus of bile duct with chronic cholecystitis without obstruction: Secondary | ICD-10-CM

## 2022-04-11 HISTORY — PX: CHOLECYSTECTOMY: SHX55

## 2022-04-11 LAB — GLUCOSE, CAPILLARY: Glucose-Capillary: 111 mg/dL — ABNORMAL HIGH (ref 70–99)

## 2022-04-11 SURGERY — LAPAROSCOPIC CHOLECYSTECTOMY
Anesthesia: General | Site: Abdomen

## 2022-04-11 MED ORDER — ONDANSETRON HCL 4 MG/2ML IJ SOLN
INTRAMUSCULAR | Status: DC | PRN
  Administered 2022-04-11: 4 mg via INTRAVENOUS

## 2022-04-11 MED ORDER — LIDOCAINE HCL (CARDIAC) PF 100 MG/5ML IV SOSY
PREFILLED_SYRINGE | INTRAVENOUS | Status: DC | PRN
  Administered 2022-04-11: 60 mg via INTRAVENOUS

## 2022-04-11 MED ORDER — ONDANSETRON HCL 4 MG/2ML IJ SOLN: 4.0000 mg | Freq: Once | INTRAMUSCULAR | Status: DC | PRN

## 2022-04-11 MED ORDER — CHLORHEXIDINE GLUCONATE 0.12 % MT SOLN
15.0000 mL | Freq: Once | OROMUCOSAL | Status: AC
  Administered 2022-04-11: 15 mL via OROMUCOSAL

## 2022-04-11 MED ORDER — PROPOFOL 10 MG/ML IV BOLUS
INTRAVENOUS | Status: AC
  Filled 2022-04-11: qty 20

## 2022-04-11 MED ORDER — OXYCODONE HCL 5 MG PO TABS: 5.0000 mg | ORAL_TABLET | Freq: Once | ORAL | Status: DC | PRN

## 2022-04-11 MED ORDER — MIDAZOLAM HCL 2 MG/2ML IJ SOLN
INTRAMUSCULAR | Status: AC
  Filled 2022-04-11: qty 2

## 2022-04-11 MED ORDER — BUPIVACAINE LIPOSOME 1.3 % IJ SUSP
INTRAMUSCULAR | Status: AC
  Filled 2022-04-11: qty 20

## 2022-04-11 MED ORDER — FENTANYL CITRATE (PF) 100 MCG/2ML IJ SOLN
INTRAMUSCULAR | Status: AC
  Filled 2022-04-11: qty 2

## 2022-04-11 MED ORDER — CHLORHEXIDINE GLUCONATE CLOTH 2 % EX PADS: 6.0000 | MEDICATED_PAD | Freq: Once | CUTANEOUS | Status: DC

## 2022-04-11 MED ORDER — MIDAZOLAM HCL 5 MG/5ML IJ SOLN
INTRAMUSCULAR | Status: DC | PRN
  Administered 2022-04-11 (×2): 2 mg via INTRAVENOUS

## 2022-04-11 MED ORDER — ORAL CARE MOUTH RINSE: 15.0000 mL | Freq: Once | OROMUCOSAL | Status: AC

## 2022-04-11 MED ORDER — DEXAMETHASONE SODIUM PHOSPHATE 10 MG/ML IJ SOLN
INTRAMUSCULAR | Status: DC | PRN
  Administered 2022-04-11: 10 mg via INTRAVENOUS

## 2022-04-11 MED ORDER — HEMOSTATIC AGENTS (NO CHARGE) OPTIME
TOPICAL | Status: DC | PRN
  Administered 2022-04-11: 1 via TOPICAL

## 2022-04-11 MED ORDER — LACTATED RINGERS IV SOLN
INTRAVENOUS | Status: DC
  Administered 2022-04-11: 1000 mL via INTRAVENOUS

## 2022-04-11 MED ORDER — OXYCODONE HCL 5 MG PO TABS: 5.0000 mg | ORAL_TABLET | ORAL | 0 refills | Status: DC | PRN

## 2022-04-11 MED ORDER — FENTANYL CITRATE (PF) 100 MCG/2ML IJ SOLN
INTRAMUSCULAR | Status: DC | PRN
  Administered 2022-04-11 (×2): 50 ug via INTRAVENOUS
  Administered 2022-04-11: 100 ug via INTRAVENOUS

## 2022-04-11 MED ORDER — SUGAMMADEX SODIUM 500 MG/5ML IV SOLN
INTRAVENOUS | Status: DC | PRN
  Administered 2022-04-11: 200 mg via INTRAVENOUS

## 2022-04-11 MED ORDER — SCOPOLAMINE 1 MG/3DAYS TD PT72
MEDICATED_PATCH | TRANSDERMAL | Status: AC
  Filled 2022-04-11: qty 1

## 2022-04-11 MED ORDER — HYDROMORPHONE HCL 1 MG/ML IJ SOLN: 0.5000 mg | Freq: Once | INTRAMUSCULAR | Status: DC

## 2022-04-11 MED ORDER — BUPIVACAINE LIPOSOME 1.3 % IJ SUSP
INTRAMUSCULAR | Status: DC | PRN
  Administered 2022-04-11: 20 mL

## 2022-04-11 MED ORDER — SODIUM CHLORIDE 0.9 % IV SOLN: 2.0000 g | INTRAVENOUS | Status: DC

## 2022-04-11 MED ORDER — DEXMEDETOMIDINE HCL IN NACL 80 MCG/20ML IV SOLN
INTRAVENOUS | Status: DC | PRN
  Administered 2022-04-11: 12 ug via BUCCAL

## 2022-04-11 MED ORDER — KETOROLAC TROMETHAMINE 30 MG/ML IJ SOLN
INTRAMUSCULAR | Status: DC | PRN
  Administered 2022-04-11: 30 mg via INTRAVENOUS

## 2022-04-11 MED ORDER — PROPOFOL 10 MG/ML IV BOLUS
INTRAVENOUS | Status: DC | PRN
  Administered 2022-04-11: 200 mg via INTRAVENOUS

## 2022-04-11 MED ORDER — SCOPOLAMINE 1 MG/3DAYS TD PT72
1.0000 | MEDICATED_PATCH | Freq: Once | TRANSDERMAL | Status: DC
  Administered 2022-04-11: 1.5 mg via TRANSDERMAL

## 2022-04-11 MED ORDER — OXYCODONE HCL 5 MG/5ML PO SOLN: 5.0000 mg | Freq: Once | ORAL | Status: DC | PRN

## 2022-04-11 MED ORDER — FENTANYL CITRATE PF 50 MCG/ML IJ SOSY
25.0000 ug | PREFILLED_SYRINGE | INTRAMUSCULAR | Status: DC | PRN
  Administered 2022-04-11: 50 ug via INTRAVENOUS
  Filled 2022-04-11: qty 1

## 2022-04-11 MED ORDER — SODIUM CHLORIDE 0.9 % IR SOLN
Status: DC | PRN
  Administered 2022-04-11: 1000 mL

## 2022-04-11 MED ORDER — CEFAZOLIN SODIUM-DEXTROSE 2-4 GM/100ML-% IV SOLN
INTRAVENOUS | Status: AC
  Filled 2022-04-11: qty 100

## 2022-04-11 MED ORDER — ROCURONIUM BROMIDE 100 MG/10ML IV SOLN
INTRAVENOUS | Status: DC | PRN
  Administered 2022-04-11: 50 mg via INTRAVENOUS

## 2022-04-11 MED ORDER — SUGAMMADEX SODIUM 500 MG/5ML IV SOLN
INTRAVENOUS | Status: AC
  Filled 2022-04-11: qty 5

## 2022-04-11 MED ORDER — HYDROMORPHONE HCL 1 MG/ML IJ SOLN
INTRAMUSCULAR | Status: AC
  Filled 2022-04-11: qty 0.5

## 2022-04-11 SURGICAL SUPPLY — 38 items
ADH SKN CLS APL DERMABOND .7 (GAUZE/BANDAGES/DRESSINGS) ×1
APL PRP STRL LF DISP 70% ISPRP (MISCELLANEOUS) ×1
APPLIER CLIP ROT 10 11.4 M/L (STAPLE) ×1
APR CLP MED LRG 11.4X10 (STAPLE) ×1
BLADE SURG 15 STRL LF DISP TIS (BLADE) ×1 IMPLANT
BLADE SURG 15 STRL SS (BLADE) ×1
CHLORAPREP W/TINT 26 (MISCELLANEOUS) ×1 IMPLANT
CLIP APPLIE ROT 10 11.4 M/L (STAPLE) ×1 IMPLANT
CLOTH BEACON ORANGE TIMEOUT ST (SAFETY) ×1 IMPLANT
COVER LIGHT HANDLE STERIS (MISCELLANEOUS) ×2 IMPLANT
DERMABOND ADVANCED .7 DNX12 (GAUZE/BANDAGES/DRESSINGS) ×1 IMPLANT
ELECT REM PT RETURN 9FT ADLT (ELECTROSURGICAL) ×1
ELECTRODE REM PT RTRN 9FT ADLT (ELECTROSURGICAL) ×1 IMPLANT
GLOVE BIO SURGEON STRL SZ 6.5 (GLOVE) ×1 IMPLANT
GLOVE BIOGEL PI IND STRL 6.5 (GLOVE) ×1 IMPLANT
GLOVE BIOGEL PI IND STRL 7.0 (GLOVE) ×2 IMPLANT
GOWN STRL REUS W/TWL LRG LVL3 (GOWN DISPOSABLE) ×3 IMPLANT
HEMOSTAT SNOW SURGICEL 2X4 (HEMOSTASIS) ×1 IMPLANT
INST SET LAPROSCOPIC AP (KITS) ×1 IMPLANT
KIT TURNOVER KIT A (KITS) ×1 IMPLANT
MANIFOLD NEPTUNE II (INSTRUMENTS) ×1 IMPLANT
NDL INSUFFLATION 14GA 120MM (NEEDLE) ×1 IMPLANT
NEEDLE INSUFFLATION 14GA 120MM (NEEDLE) ×1 IMPLANT
NS IRRIG 1000ML POUR BTL (IV SOLUTION) ×1 IMPLANT
PACK LAP CHOLE LZT030E (CUSTOM PROCEDURE TRAY) ×1 IMPLANT
PAD ARMBOARD 7.5X6 YLW CONV (MISCELLANEOUS) ×1 IMPLANT
SET BASIN LINEN APH (SET/KITS/TRAYS/PACK) ×1 IMPLANT
SET TUBE SMOKE EVAC HIGH FLOW (TUBING) ×1 IMPLANT
SLEEVE Z-THREAD 5X100MM (TROCAR) ×1 IMPLANT
SUT MNCRL AB 4-0 PS2 18 (SUTURE) ×2 IMPLANT
SUT VICRYL 0 UR6 27IN ABS (SUTURE) ×1 IMPLANT
SYS BAG RETRIEVAL 10MM (BASKET) ×1
SYSTEM BAG RETRIEVAL 10MM (BASKET) ×1 IMPLANT
TROCAR Z-THRD FIOS HNDL 11X100 (TROCAR) ×1 IMPLANT
TROCAR Z-THREAD FIOS 5X100MM (TROCAR) ×1 IMPLANT
TROCAR Z-THREAD SLEEVE 11X100 (TROCAR) ×1 IMPLANT
TUBE CONNECTING 12X1/4 (SUCTIONS) ×1 IMPLANT
WARMER LAPAROSCOPE (MISCELLANEOUS) ×1 IMPLANT

## 2022-04-11 NOTE — Transfer of Care (Signed)
Immediate Anesthesia Transfer of Care Note  Patient: Melinda Castillo  Procedure(s) Performed: LAPAROSCOPIC CHOLECYSTECTOMY (Abdomen)  Patient Location: PACU  Anesthesia Type:General  Level of Consciousness: awake, drowsy and patient cooperative  Airway & Oxygen Therapy: Patient Spontanous Breathing  Post-op Assessment: Report given to RN, Post -op Vital signs reviewed and stable and Patient moving all extremities X 4  Post vital signs: Reviewed and stable  Last Vitals:  Vitals Value Taken Time  BP 148/97 04/11/22 1418  Temp 36.7 C 04/11/22 1418  Pulse 70 04/11/22 1422  Resp 0 04/11/22 1422  SpO2 94 % 04/11/22 1422  Vitals shown include unvalidated device data.  Last Pain:  Vitals:   04/11/22 1418  TempSrc:   PainSc: 0-No pain      Patients Stated Pain Goal: 7 (70/62/37 [id number])  Complications: No notable events documented.

## 2022-04-11 NOTE — Anesthesia Preprocedure Evaluation (Addendum)
Anesthesia Evaluation  Patient identified by MRN, date of birth, ID band Patient awake    Reviewed: Allergy & Precautions, NPO status , Patient's Chart, lab work & pertinent test results  Airway Mallampati: II  TM Distance: >3 FB Neck ROM: Full    Dental no notable dental hx. (+) Dental Advisory Given, Teeth Intact   Pulmonary neg pulmonary ROS,    Pulmonary exam normal breath sounds clear to auscultation       Cardiovascular Exercise Tolerance: Good hypertension, Normal cardiovascular exam Rhythm:Regular Rate:Normal     Neuro/Psych negative neurological ROS  negative psych ROS   GI/Hepatic negative GI ROS, Neg liver ROS,   Endo/Other  diabetes, Type 2, Oral Hypoglycemic Agents  Renal/GU      Musculoskeletal negative musculoskeletal ROS (+)   Abdominal (+) + obese (BMI 33.23),   Peds  Hematology Lab Results      Component                Value               Date                      WBC                      4.8                 03/03/2021                HGB                      16.0 (H)            06/16/2021                HCT                      47.0 (H)            06/16/2021                MCV                      89.3                03/03/2021                PLT                      133 (L)             03/03/2021              Anesthesia Other Findings   Reproductive/Obstetrics negative OB ROS                             Anesthesia Physical  Anesthesia Plan  ASA: 2  Anesthesia Plan: General and General ETT   Post-op Pain Management: Toradol IV (intra-op)   Induction: Intravenous  PONV Risk Score and Plan: 4 or greater and Treatment may vary due to age or medical condition, Midazolam and Ondansetron  Airway Management Planned: Oral ETT  Additional Equipment: None  Intra-op Plan:   Post-operative Plan: Extubation in OR  Informed Consent: I have reviewed the patients  History and Physical, chart, labs and discussed the procedure including the risks, benefits and alternatives for the  proposed anesthesia with the patient or authorized representative who has indicated his/her understanding and acceptance.     Dental advisory given  Plan Discussed with: CRNA and Anesthesiologist  Anesthesia Plan Comments:        Anesthesia Quick Evaluation

## 2022-04-11 NOTE — Op Note (Addendum)
Operative Note   Preoperative Diagnosis: Chronic cholecystitis    Postoperative Diagnosis: Same   Procedure(s) Performed: Laparoscopic cholecystectomy   Surgeon: Ria Comment C. Constance Haw, MD   Assistants: No qualified resident was available   Anesthesia: General endotracheal   Anesthesiologist: Louann Sjogren, MD    Specimens: Gallbladder    Estimated Blood Loss: Minimal    Blood Replacement: None    Complications: None    Operative Findings:  Intrahepatic gallbladder with some edema; normal appendix       Procedure: The patient was taken to the operating room and placed supine. General endotracheal anesthesia was induced. Intravenous antibiotics were administered per protocol. An orogastric tube positioned to decompress the stomach. The abdomen was prepared and draped in the usual sterile fashion.    A supraumbilical incision was made and a Veress technique was utilized to achieve pneumoperitoneum to 15 mmHg with carbon dioxide. A 11 mm optiview port was placed through the supraumbilical region, and a 10 mm 0-degree operative laparoscope was introduced. The area underlying the trocar and Veress needle were inspected and without evidence of injury.  Remaining trocars were placed under direct vision. Two 5 mm ports were placed in the right abdomen, between the anterior axillary and midclavicular line.  A final 11 mm port was placed through the mid-epigastrium, near the falciform ligament.   The abdomen was inspected and the bowel was normal appearing, the pelvis was normal appearing, the stomach, and the appendix. The gallbladder was intrahepatic in nature. I took a little bit of the dome down from the liver to allow for better retraction.    The gallbladder fundus was elevated cephalad and the infundibulum was retracted to the patient's right. The gallbladder/cystic duct junction was skeletonized. The cystic artery noted in the triangle of Calot and was also skeletonized.  We then  continued liberal medial and lateral dissection until the critical view of safety was achieved. There was minor spillage of bile that was all suctioned up.   The cystic duct was triply clipped and cystic artery was doubly clipped and both were divided. The gallbladder was then dissected from the liver bed with electrocautery. The specimen was placed in an Endopouch and was retrieved through the epigastric site.   Final inspection revealed acceptable hemostasis. Surgical SNOW was placed in the gallbladder bed.  Trocars were removed and pneumoperitoneum was released.  0 Vicryl fascial sutures were used to close the umbilical port site and the epigastric site was overlying the rib so I did not close this. Exparel was injected Skin incisions were closed with 4-0 Monocryl subcuticular sutures and Dermabond. The patient was awakened from anesthesia and extubated without complication.    Curlene Labrum, MD [facility] [address] [place], [place] [address] [phone] (office)

## 2022-04-11 NOTE — Progress Notes (Signed)
[facility]  Updated husband and Rx sent to [facility]. Will check on in 2 weeks.  Curlene Labrum, MD [facility] [address] [place], [place] [address] [phone] (office)

## 2022-04-11 NOTE — Anesthesia Procedure Notes (Signed)
Procedure Name: Intubation Date/Time: 04/11/2022 1:18 PM  Performed by: Jonna Munro, CRNAPre-anesthesia Checklist: Patient identified, Emergency Drugs available, Suction available, Patient being monitored and Timeout performed Patient Re-evaluated:Patient Re-evaluated prior to induction Oxygen Delivery Method: Circle system utilized Preoxygenation: Pre-oxygenation with 100% oxygen Induction Type: IV induction Ventilation: Mask ventilation without difficulty Laryngoscope Size: Glidescope, 3 and Mac Grade View: Grade I Tube type: Oral Tube size: 7.0 mm Number of attempts: 1 Airway Equipment and Method: Stylet and Video-laryngoscopy Placement Confirmation: ETT inserted through vocal cords under direct vision, positive ETCO2, CO2 detector and breath sounds checked- equal and bilateral Secured at: 22 cm Tube secured with: Tape Dental Injury: Teeth and Oropharynx as per pre-operative assessment

## 2022-04-11 NOTE — Discharge Instructions (Signed)
Discharge Laparoscopic Surgery Instructions:  Common Complaints: Right shoulder pain is common after laparoscopic surgery. This is secondary to the gas used in the surgery being trapped under the diaphragm.  Walk to help your body absorb the gas. This will improve in a few days. Pain at the port sites are common, especially the larger port sites. This will improve with time.  Some nausea is common and poor appetite. The main goal is to stay hydrated the first few days after surgery.   Diet/ Activity: Diet as tolerated. You may not have an appetite, but it is important to stay hydrated. Drink 64 ounces of water a day. Your appetite will return with time.  Shower per your regular routine daily.  Do not take hot showers. Take warm showers that are less than 10 minutes. Rest and listen to your body, but do not remain in bed all day.  Walk everyday for at least 15-20 minutes. Deep cough and move around every 1-2 hours in the first few days after surgery.  Do not lift > 10 lbs, perform excessive bending, pushing, pulling, squatting for 1-2 weeks after surgery.  Do not pick at the dermabond glue on your incision sites.  This glue film will remain in place for 1-2 weeks and will start to peel off.  Do not place lotions or balms on your incision unless instructed to specifically by Dr. Caprisha Bridgett.   Pain Expectations and Narcotics: -After surgery you will have pain associated with your incisions and this is normal. The pain is muscular and nerve pain, and will get better with time. -You are encouraged and expected to take non narcotic medications like tylenol and ibuprofen (when able) to treat pain as multiple modalities can aid with pain treatment. -Narcotics are only used when pain is severe or there is breakthrough pain. -You are not expected to have a pain score of 0 after surgery, as we cannot prevent pain. A pain score of 3-4 that allows you to be functional, move, walk, and tolerate some activity is  the goal. The pain will continue to improve over the days after surgery and is dependent on your surgery. -Due to [place] law, we are only able to give a certain amount of pain medication to treat post operative pain, and we only give additional narcotics on a patient by patient basis.  -For most laparoscopic surgery, studies have shown that the majority of patients only need 10-15 narcotic pills, and for open surgeries most patients only need 15-20.   -Having appropriate expectations of pain and knowledge of pain management with non narcotics is important as we do not want anyone to become addicted to narcotic pain medication.  -Using ice packs in the first 48 hours and heating pads after 48 hours, wearing an abdominal binder (when recommended), and using over the counter medications are all ways to help with pain management.   -Simple acts like meditation and mindfulness practices after surgery can also help with pain control and research has proven the benefit of these practices.  Medication: Take tylenol and ibuprofen as needed for pain control, alternating every 4-6 hours.  Example:  Tylenol 1000mg @ 6am, 12noon, 6pm, 12midnight (Do not exceed 4000mg of tylenol a day). Ibuprofen 800mg @ 9am, 3pm, 9pm, 3am (Do not exceed 3600mg of ibuprofen a day).  Take Roxicodone for breakthrough pain every 4 hours.  Take Colace for constipation related to narcotic pain medication. If you do not have a bowel movement in 2 days, take Miralax   over the counter.  Drink plenty of water to also prevent constipation.   Contact Information: If you have questions or concerns, please call our office, [phone], Monday- Thursday 8AM-5PM and Friday 8AM-12Noon.  If it is after hours or on the weekend, please call [facility] Main [facility], [phone], [phone], and ask to speak to the surgeon on call for Dr. Shay Jhaveri at [place].   

## 2022-04-11 NOTE — Interval H&P Note (Signed)
History and Physical Interval Note:  04/11/2022 12:27 PM  Melinda Castillo  has presented today for surgery, with the diagnosis of chronic cholecystitis/ biliary colic.  The various methods of treatment have been discussed with the patient and family. After consideration of risks, benefits and other options for treatment, the patient has consented to  Procedure(s): LAPAROSCOPIC CHOLECYSTECTOMY (N/A) as a surgical intervention.  The patient's history has been reviewed, patient examined, no change in status, stable for surgery.  I have reviewed the patient's chart and labs.  Questions were answered to the patient's satisfaction.     Virl Cagey

## 2022-04-11 NOTE — Telephone Encounter (Signed)
FMLA paperwork completed and faxed to [employer] at [phone] and received confirmation.  Patient out of work 04/11/2022 and may return unrestricted on 04/25/2022.

## 2022-04-12 LAB — SURGICAL PATHOLOGY

## 2022-04-12 NOTE — Anesthesia Postprocedure Evaluation (Signed)
Anesthesia Post Note  Patient: Melinda Castillo  Procedure(s) Performed: LAPAROSCOPIC CHOLECYSTECTOMY (Abdomen)  Patient location during evaluation: Phase II Anesthesia Type: General Level of consciousness: awake Pain management: pain level controlled Vital Signs Assessment: post-procedure vital signs reviewed and stable Respiratory status: spontaneous breathing and respiratory function stable Cardiovascular status: blood pressure returned to baseline and stable Postop Assessment: no headache and no apparent nausea or vomiting Anesthetic complications: no Comments: Late entry   No notable events documented.   Last Vitals:  Vitals:   04/11/22 1500 04/11/22 1510  BP: (!) 147/97 (!) 132/99  Pulse: 84 82  Resp:  16  Temp:  36.6 C  SpO2: 94% 96%    Last Pain:  Vitals:   04/11/22 1510  TempSrc: Oral  PainSc: [place]

## 2022-04-12 NOTE — Progress Notes (Signed)
I notified the patient of the findings.

## 2022-04-15 ENCOUNTER — Encounter (HOSPITAL_COMMUNITY): Payer: Self-pay | Admitting: General Surgery

## 2022-04-15 NOTE — Telephone Encounter (Signed)
Surgical Date: 04/11/2022 Procedure: Lap Chole  Received call from patient [phone]~ telephone.   Requested return to work note for Monday, 04/18/2022. Requested note to be sent to [employer]~ [phone]~ fax.  States that she is healing well and feels able to return to work with no restrictions.   Letter transcribed.

## 2022-04-27 ENCOUNTER — Ambulatory Visit (INDEPENDENT_AMBULATORY_CARE_PROVIDER_SITE_OTHER): Payer: 59 | Admitting: General Surgery

## 2022-04-27 DIAGNOSIS — K811 Chronic cholecystitis: Secondary | ICD-10-CM

## 2022-04-27 NOTE — Progress Notes (Signed)
[facility]  I am talking to the patient for post operative evaluation. This is not a billable encounter as it is under the [place] charges for the surgery.  The patient had a laparoscopic cholecystectomy on 10/16. The patient reports that she is doing well. The are tolerating a diet, having good pain control, and having regular Bms.  The incisions are healing. The patient has no concerns.   Pathology: A. GALLBLADDER, CHOLECYSTECTOMY:  - Chronic cholecystitis and cholelithiasis   Will see the patient PRN.   Curlene Labrum, MD [facility] [address] [place], [place] [address] [phone] (office)

## 2022-05-04 ENCOUNTER — Other Ambulatory Visit (HOSPITAL_COMMUNITY): Payer: Self-pay

## 2022-05-16 DIAGNOSIS — N979 Female infertility, unspecified: Secondary | ICD-10-CM | POA: Diagnosis not present

## 2022-05-17 ENCOUNTER — Other Ambulatory Visit (HOSPITAL_COMMUNITY): Payer: Self-pay

## 2022-05-17 MED ORDER — CLOMID 50 MG PO TABS
100.0000 mg | ORAL_TABLET | Freq: Every day | ORAL | 0 refills | Status: DC
  Filled 2022-05-17: qty 10, 5d supply, fill #0

## 2022-05-18 ENCOUNTER — Other Ambulatory Visit (HOSPITAL_COMMUNITY): Payer: Self-pay

## 2022-07-12 DIAGNOSIS — Z3169 Encounter for other general counseling and advice on procreation: Secondary | ICD-10-CM | POA: Diagnosis not present

## 2022-07-14 DIAGNOSIS — Z3141 Encounter for fertility testing: Secondary | ICD-10-CM | POA: Diagnosis not present

## 2022-07-15 ENCOUNTER — Other Ambulatory Visit (HOSPITAL_COMMUNITY): Payer: Self-pay

## 2022-07-15 MED ORDER — D 5000 125 MCG (5000 UT) PO CAPS
ORAL_CAPSULE | ORAL | 3 refills | Status: DC
  Filled 2022-07-16: qty 30, fill #0
  Filled 2022-07-18: qty 30, 30d supply, fill #0

## 2022-07-16 ENCOUNTER — Other Ambulatory Visit (HOSPITAL_COMMUNITY): Payer: Self-pay

## 2022-07-18 ENCOUNTER — Other Ambulatory Visit (HOSPITAL_COMMUNITY): Payer: Self-pay

## 2022-07-19 ENCOUNTER — Other Ambulatory Visit (HOSPITAL_COMMUNITY): Payer: Self-pay

## 2022-07-20 ENCOUNTER — Other Ambulatory Visit: Payer: Self-pay

## 2022-07-20 ENCOUNTER — Other Ambulatory Visit (HOSPITAL_COMMUNITY): Payer: Self-pay

## 2022-07-20 MED ORDER — MENOPUR 75 UNITS ~~LOC~~ SOLR
150.0000 [IU] | Freq: Every day | SUBCUTANEOUS | 3 refills | Status: DC
  Filled 2022-07-20 – 2022-07-29 (×2): qty 25, 12d supply, fill #0

## 2022-07-20 MED ORDER — "BD LUER-LOK SYRINGE 18G X 1-1/2"" 3 ML MISC"
3 refills | Status: DC
  Filled 2022-07-20: qty 20, 20d supply, fill #0

## 2022-07-20 MED ORDER — GANIRELIX ACETATE 250 MCG/0.5ML ~~LOC~~ SOSY
250.0000 ug | PREFILLED_SYRINGE | Freq: Every day | SUBCUTANEOUS | 3 refills | Status: DC
  Filled 2022-07-20: qty 3, 6d supply, fill #0

## 2022-07-20 MED ORDER — GONAL-F RFF REDIJECT 900 UNIT/1.5ML ~~LOC~~ SOPN
300.0000 [IU] | PEN_INJECTOR | Freq: Every day | SUBCUTANEOUS | 3 refills | Status: DC
  Filled 2022-07-20: qty 6, 12d supply, fill #0
  Filled 2022-07-29: qty 6, 30d supply, fill #0

## 2022-07-20 MED ORDER — ZOMACTON 10 MG ~~LOC~~ SOLR
SUBCUTANEOUS | 3 refills | Status: DC
  Filled 2022-07-20: qty 1, 30d supply, fill #0

## 2022-07-20 MED ORDER — "BD DISP NEEDLES 30G X 1/2"" MISC"
3 refills | Status: DC
  Filled 2022-07-20: qty 20, 20d supply, fill #0

## 2022-07-20 MED ORDER — "INSULIN SYRINGE-NEEDLE U-100 30G X 5/16"" 0.5 ML MISC"
3 refills | Status: DC
  Filled 2022-07-20: qty 20, 20d supply, fill #0

## 2022-07-21 ENCOUNTER — Other Ambulatory Visit (HOSPITAL_COMMUNITY): Payer: Self-pay

## 2022-07-25 ENCOUNTER — Ambulatory Visit: Payer: Self-pay | Admitting: Family Medicine

## 2022-07-26 ENCOUNTER — Ambulatory Visit (INDEPENDENT_AMBULATORY_CARE_PROVIDER_SITE_OTHER): Payer: 59 | Admitting: Family Medicine

## 2022-07-26 ENCOUNTER — Encounter: Payer: Self-pay | Admitting: Family Medicine

## 2022-07-26 VITALS — BP 133/85 | HR 88 | Temp 97.6°F | Wt 250.2 lb

## 2022-07-26 DIAGNOSIS — E1169 Type 2 diabetes mellitus with other specified complication: Secondary | ICD-10-CM

## 2022-07-26 DIAGNOSIS — E669 Obesity, unspecified: Secondary | ICD-10-CM | POA: Diagnosis not present

## 2022-07-26 MED ORDER — METFORMIN HCL ER 750 MG PO TB24
750.0000 mg | ORAL_TABLET | Freq: Two times a day (BID) | ORAL | 1 refills | Status: DC
  Filled 2022-07-26 – 2022-10-17 (×2): qty 180, 90d supply, fill #0
  Filled 2023-01-02 – 2023-01-07 (×2): qty 180, 90d supply, fill #1

## 2022-07-26 NOTE — Progress Notes (Signed)
Patient ID: Melinda Castillo, female  DOB: 1985-09-23, 37 y.o.   MRN: [id number] Patient Care Team    Relationship Specialty Notifications Start End  Ma Hillock, DO PCP - General Family Medicine  07/22/20   Haynes Dage, OD  Optometry  07/22/20   Janie Morning, MD  Gastroenterology  07/22/20   Paula Compton, MD Consulting Physician Obstetrics and Gynecology  11/02/21     Chief Complaint  Patient presents with   Diabetes    01/18 A1c 6.8    Subjective:  Melinda Castillo is a 37 y.o.  Female  present for Chronic Conditions/illness Management All past medical history, surgical history, allergies, family history, immunizations, medications and social history were updated in the electronic medical record today. All recent labs, ED visits and hospitalizations within the last year were reviewed.  Diabetes type 2: Pt reports compliance with metformin 750 ER BID. She did require insulin with pregnancy. Patient denies dizziness, hyperglycemic or hypoglycemic events. Patient denies numbness, tingling in the extremities or nonhealing wounds of feet.       09/29/2021    2:11 PM 05/03/2021    3:35 PM 07/22/2020    3:17 PM  Depression screen PHQ 2/9  Decreased Interest 0 0 0  Down, Depressed, Hopeless 0 0 0  PHQ - 2 Score 0 0 0       No data to display           Immunization History  Administered Date(s) Administered   HPV Quadrivalent 01/29/2009   Hepatitis B 03/08/2011   Influenza Split 03/28/2013   Influenza, Quadrivalent, Recombinant, Inj, Pf 03/28/2019   Influenza, Seasonal, Injecte, Preservative Fre 03/27/2018, 03/28/2019   Influenza-Unspecified 03/27/2020, 03/27/2021   MMR 05/14/1994   PFIZER(Purple Top)SARS-COV-2 Vaccination 06/29/2019, 07/20/2019, 03/26/2020   Pneumococcal Polysaccharide-23 07/22/2020   Rho (D) Immune Globulin 07/15/2019, 09/26/2019   Tdap 08/02/2019     Past Medical History:  Diagnosis Date   Chronic hypertension affecting  pregnancy    Essential hypertension    History of ectopic pregnancy 01/2021   treated with medication   History of positive PPD 2010   per pt skin test, and had negative cxr   History of pregnancy induced hypertension    Infertility, 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 IVF pregnancy through Dr Rolin Barry Day 5 embryo transfer. PGS-A testing completed   Nausea and vomiting 04/04/2022   Pelvic hematoma, female    S/P dilatation and curettage 03/01/2021   Type 2 diabetes mellitus ([place])    followed by pcp  (06-10-2021  pt stated does not check blood sugar at home)   Wears contact lenses    No Known Allergies Past Surgical History:  Procedure Laterality Date   BREAST REDUCTION SURGERY Bilateral 2013   BREAST SURGERY  06/13   CESAREAN SECTION  09/25/2019   @[facility]    CHOLECYSTECTOMY N/A 04/11/2022   Procedure: LAPAROSCOPIC CHOLECYSTECTOMY;  Surgeon: Virl Cagey, MD;  Location: [facility];  Service: General;  Laterality: N/A;   COLONOSCOPY WITH ESOPHAGOGASTRODUODENOSCOPY (EGD)  07/2018   r/o celiac- completed for IBS sx. Normal.    DILATATION AND CURETTAGE/HYSTEROSCOPY WITH MINERVA  11/2021   DILATION AND CURETTAGE OF UTERUS N/A 03/01/2021   Procedure: DILATATION AND CURETTAGE WITH ULTRASOUND GUIDANCE;  Surgeon: Governor Specking, MD;  Location: [place];  Service: Gynecology;  Laterality: N/A;   IR ANGIOGRAM PELVIS SELECTIVE OR SUPRASELECTIVE  03/03/2021   IR ANGIOGRAM SELECTIVE EACH ADDITIONAL VESSEL  03/03/2021   IR ANGIOGRAM SELECTIVE EACH ADDITIONAL VESSEL  03/03/2021   IR EMBO ART  VEN HEMORR LYMPH EXTRAV  INC GUIDE ROADMAPPING  03/02/2021   IR EMBO ART  VEN HEMORR LYMPH EXTRAV  INC GUIDE ROADMAPPING  03/02/2021   IR US GUIDE VASC ACCESS RIGHT  03/02/2021   ROBOTIC ASSISTED LAPAROSCOPIC LYSIS OF ADHESION N/A 06/16/2021   Procedure: XI ROBOTIC ASSISTED LAPAROSCOPIC REPAIR CESEARAN SECTION SCAR DEFECT, LYSIS OF ADHESIONS;  Surgeon: Governor Specking, MD;  Location: [facility];  Service: Gynecology;  Laterality: N/A;   TONSILLECTOMY  2006   Family History  Problem Relation Age of Onset   Hypertension Mother    Hypertension Father    Ovarian cancer Maternal Grandmother    Early death Maternal Grandmother    Heart disease Maternal Grandfather    Brain cancer Paternal Grandfather    Stroke Paternal Grandmother    Colon cancer Neg Hx    Colon polyps Neg Hx    Social History   Social History Narrative   Marital status/children/pets: married, 1 child.    Education/employment: Statistician, works as Immunologist at [employer].    Safety:      -Wears a bicycle helmet riding a bike: Yes     -smoke alarm in the home:Yes     - wears seatbelt: Yes     - Feels safe in their relationships: Yes    Allergies as of 07/26/2022   No Known Allergies      Medication List        Accurate as of July 26, 2022  4:05 PM. If you have any questions, ask your nurse or doctor.          B-D 3CC LUER-LOK SYR 18GX1-1/2 18G X 1-1/2" 3 ML Misc Generic drug: SYRINGE-NEEDLE (DISP) 3 ML Use daily as directed   BD Disp Needles 30G X 1/2" Misc Generic drug: NEEDLE (DISP) 30 G Use as directed for injection daily.   Clomid 50 MG tablet Generic drug: clomiPHENE Take 2 tablets (100 mg total) by mouth daily for 5 days   D 5000 125 MCG (5000 UT) capsule Generic drug: Cholecalciferol Take 1 capsule (5,000 Units total) by mouth daily.   Ganirelix Acetate 250 MCG/0.5ML Sosy Inject 250 mcg into the skin daily.   Gonal-f RFF Rediject 900 UNIT/1.5ML Sopn Generic drug: Follitropin Alfa Inject 0.5 ml (300 Units) into the skin daily.   Insulin Syringe-Needle U-100 30G X 5/16" 0.5 ML Misc Use daily as directed   Menopur 75 units Solr Generic drug: Menotropins Inject 150 Units into the skin daily.   metFORMIN 750 MG 24 hr tablet Commonly known as: GLUCOPHAGE-XR Take 1 tablet (750 mg total) by mouth 2 (two) times daily.    MULTIVITAMIN ADULT PO Take by mouth daily.   Zomacton 10 MG Solr Generic drug: Somatropin Dilute with 33ml prefilled diluent and inject 10 units daily   ZyrTEC Allergy 10 MG Caps Generic drug: Cetirizine HCl Take 10 mg by mouth daily as needed.        All past medical history, surgical history, allergies, family history, immunizations andmedications were updated in the EMR today and reviewed under the history and medication portions of their EMR.     No results found for this or any previous visit (from the past 2160 hour(s)).  No results found.   ROS 14 pt review of systems performed and negative (unless mentioned in an HPI)  Objective: BP 133/85   Pulse 88  Temp 97.6 F (36.4 C)   Wt 250 lb 3.2 oz (113.5 kg)   LMP 07/11/2022 (Exact Date)   SpO2 100%   BMI 35.90 kg/m  Patient's last menstrual period was 07/11/2022 (exact date). Physical Exam Vitals and nursing note reviewed.  Constitutional:      General: She is not in acute distress.    Appearance: Normal appearance. She is not ill-appearing, toxic-appearing or diaphoretic.  HENT:     Head: Normocephalic and atraumatic.  Eyes:     General: No scleral icterus.       Right eye: No discharge.        Left eye: No discharge.     Extraocular Movements: Extraocular movements intact.     Conjunctiva/sclera: Conjunctivae normal.     Pupils: Pupils are equal, round, and reactive to light.  Cardiovascular:     Rate and Rhythm: Normal rate and regular rhythm.  Pulmonary:     Effort: Pulmonary effort is normal. No respiratory distress.     Breath sounds: Normal breath sounds. No wheezing, rhonchi or rales.  Musculoskeletal:     Right lower leg: No edema.     Left lower leg: No edema.  Skin:    General: Skin is warm.     Findings: No rash.  Neurological:     Mental Status: She is alert and oriented to person, place, and time. Mental status is at baseline.     Motor: No weakness.     Gait: Gait normal.   Psychiatric:        Mood and Affect: Mood normal.        Behavior: Behavior normal.        Thought Content: Thought content normal.        Judgment: Judgment normal.      No results found.  Assessment/plan: Avri Paiva is a 37 y.o. female present for Chronic Conditions/illness Management Diabetes mellitus type 2 in obese ([place]) stable Continue  metformin 750 mg BID - diabetic diet - continue  routine exercise.  PNA series: PNA23 UTD Flu shot: UTD 2023(recommneded yearly) Microalb: UTD 07/2021> collected today Foot exam: 11/02/2021 Eye exam: UTD Dr. Domingo Cocking- A1c: 7.5 in October 2021> 6.7>5.9> 5.7> 6.3> 7.0> 6.8 collected today   Return in about 24 weeks (around 01/10/2023) for Routine chronic condition follow-up.  Orders Placed This Encounter  Procedures   Urine Microalbumin w/creat. ratio   Meds ordered this encounter  Medications   metFORMIN (GLUCOPHAGE-XR) 750 MG 24 hr tablet    Sig: Take 1 tablet (750 mg total) by mouth 2 (two) times daily.    Dispense:  180 tablet    Refill:  1   Referral Orders  No referral(s) requested today     Electronically signed by: Howard Pouch, [place]

## 2022-07-26 NOTE — Patient Instructions (Addendum)
Return in about 24 weeks (around 01/10/2023).        Great to see you today.  I have refilled the medication(s) we provide.   If labs were collected, we will inform you of lab results once received either by echart message or telephone call.   - echart message- for normal results that have been seen by the patient already.   - telephone call: abnormal results or if patient has not viewed results in their echart.

## 2022-07-27 ENCOUNTER — Other Ambulatory Visit: Payer: Self-pay

## 2022-07-27 LAB — MICROALBUMIN / CREATININE URINE RATIO
Creatinine,U: 54.8 mg/dL
Microalb Creat Ratio: 1.3 mg/g (ref 0.0–30.0)
Microalb, Ur: <0.7 mg/dL (ref 0.0–1.9)

## 2022-07-28 ENCOUNTER — Encounter (HOSPITAL_COMMUNITY): Payer: Self-pay

## 2022-07-28 ENCOUNTER — Other Ambulatory Visit (HOSPITAL_COMMUNITY): Payer: Self-pay

## 2022-07-28 ENCOUNTER — Other Ambulatory Visit: Payer: Self-pay

## 2022-07-29 ENCOUNTER — Other Ambulatory Visit: Payer: Self-pay

## 2022-07-29 ENCOUNTER — Other Ambulatory Visit (HOSPITAL_COMMUNITY): Payer: Self-pay

## 2022-08-01 ENCOUNTER — Other Ambulatory Visit (HOSPITAL_COMMUNITY): Payer: Self-pay

## 2022-08-01 ENCOUNTER — Other Ambulatory Visit: Payer: Self-pay

## 2022-08-02 ENCOUNTER — Other Ambulatory Visit: Payer: Self-pay

## 2022-08-02 ENCOUNTER — Other Ambulatory Visit (HOSPITAL_COMMUNITY): Payer: Self-pay

## 2022-08-03 ENCOUNTER — Other Ambulatory Visit: Payer: Self-pay

## 2022-08-05 DIAGNOSIS — Z3141 Encounter for fertility testing: Secondary | ICD-10-CM | POA: Diagnosis not present

## 2022-08-09 ENCOUNTER — Other Ambulatory Visit: Payer: Self-pay

## 2022-08-10 DIAGNOSIS — Z3141 Encounter for fertility testing: Secondary | ICD-10-CM | POA: Diagnosis not present

## 2022-08-31 DIAGNOSIS — Z3141 Encounter for fertility testing: Secondary | ICD-10-CM | POA: Diagnosis not present

## 2022-09-05 IMAGING — US US MFM OB TRANSVAGINAL
1 series · 13 of 28 positions shown · non-contrast
Comparison: none

[REDACTED]. [HOSPITAL]
                   DO

 1  US MFM OB TRANSVAGINAL                76817.2     CHARLES DANIEL ADEDAPO
Indications
 Less than 8 weeks gestation of pregnancy
 Evaluation for a suspected ectopic
 pregnancy (cesarean scar pregnancy)
 Previous cesarean delivery, antepartum
Fetal Evaluation
 Num Of Fetuses:         1
 Fetal Heart Rate(bpm):  109
 Cardiac Activity:       Observed
 Amniotic Fluid
 AFI FV:      Within normal limits
 Comment:    SCH superior to the gestational sac.
Biometry
 CRL:       2.4  mm     G. Age:  5w 5d                   EDD:   10/01/21
OB History
 Gravidity:    2         Term:   1
 Living:       1
Gestational Age
 LMP:           6w 3d         Date:  12/20/20                 EDD:   09/26/21
 Best:          6w 3d      Det. By:  LMP  (12/20/20)          EDD:   09/26/21
Cervix Uterus Adnexa
 Cervix
 Closed
 Right Ovary
 Within normal limits.
 Left Ovary
 Cul De Sac
 No free fluid seen.
Impression
 We performed a transabdominal and transvaginal ultrasound.
 Findings include
 -A single intrauterine gestational sac is seen in the lower
 uterus (center of gestational sac below the midpoint of the
 uterus).
 -Fetal pole with CRL measurement consistent with the
 previously established date is seen.  Fetal heart rate was
 109/min.
 -Gestational sac is close to the previous cesarean scar but
 not invading the niche. Sac appears triangular at the level of
 scar.
 -Myometrium is thinned out and is less than 3 mm at the level
 of gestational sac.
 -No bulging of the sac into the bladder.  Myometrial bladder
 interface appears normal.
 -Color-flow shows increased vascularity at the implantation
 site.
 -An echogenic material consistent with blood clot is seen
 close to the fundus (history of vaginal bleeding).

[Series 1: us mfm ob transvaginal · 30 acquisitions, 13 frames shown]
[im 2/30]
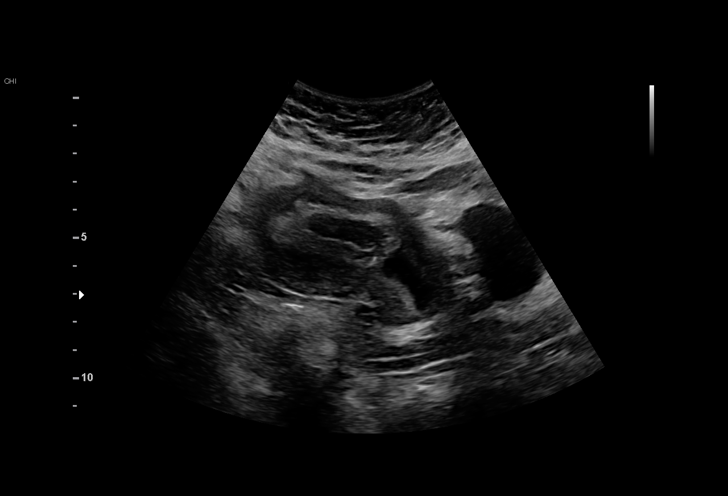
[im 4/30]
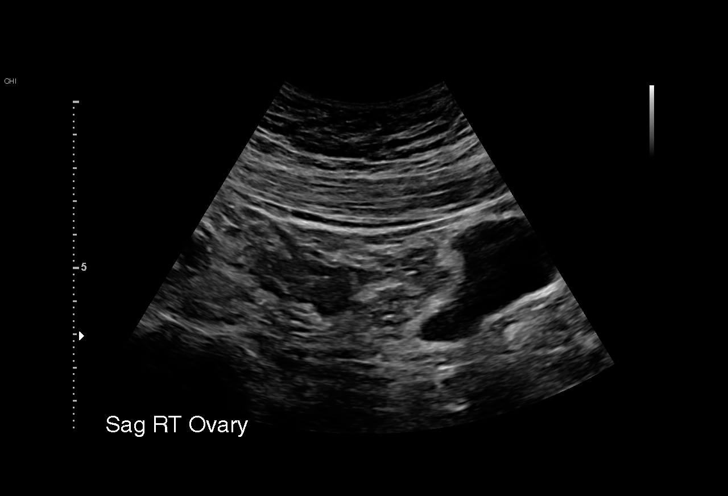
[im 6/30]
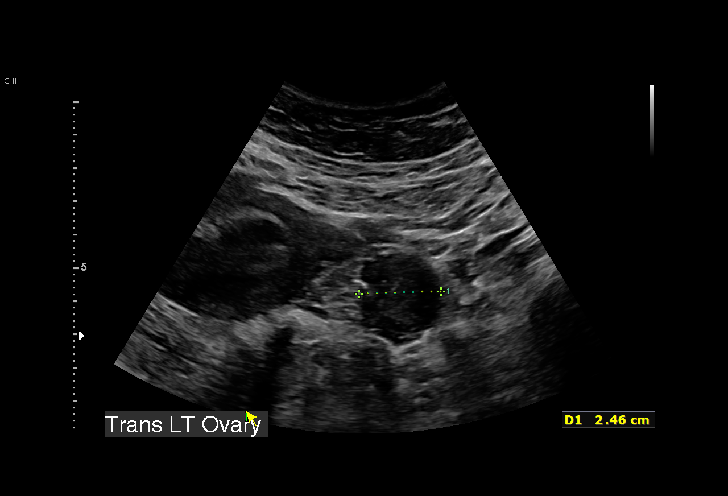
[im 8/30]
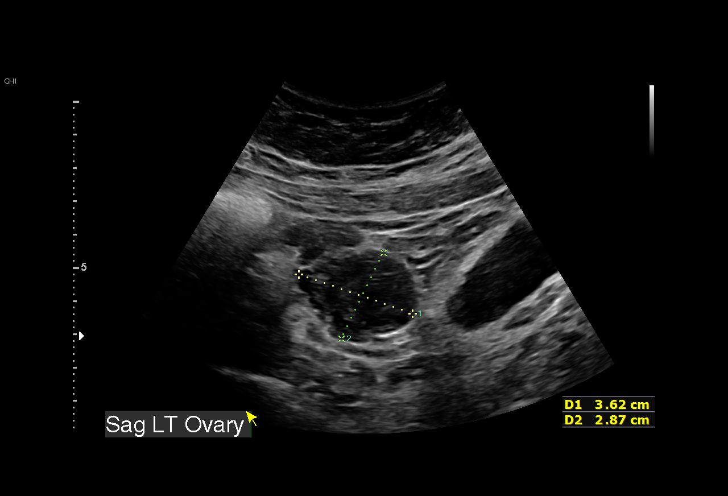
[im 10/30]
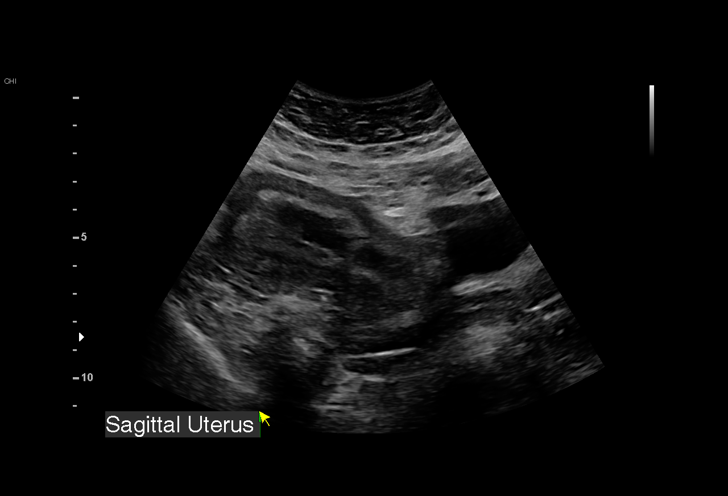
[im 12/30]
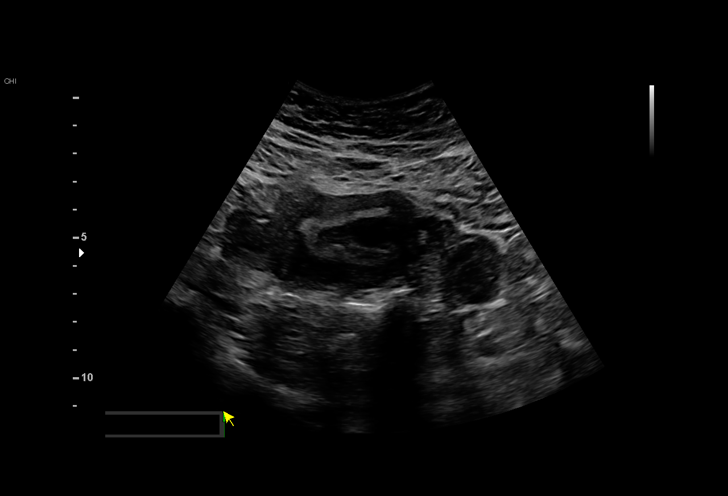
[im 16/30]
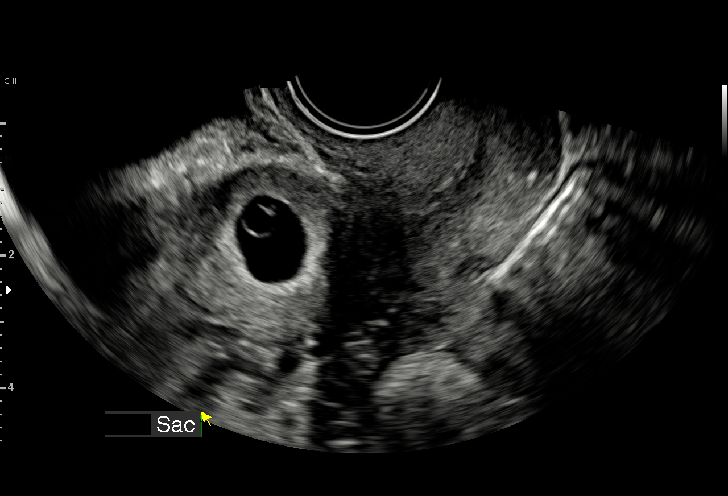
[im 18/30]
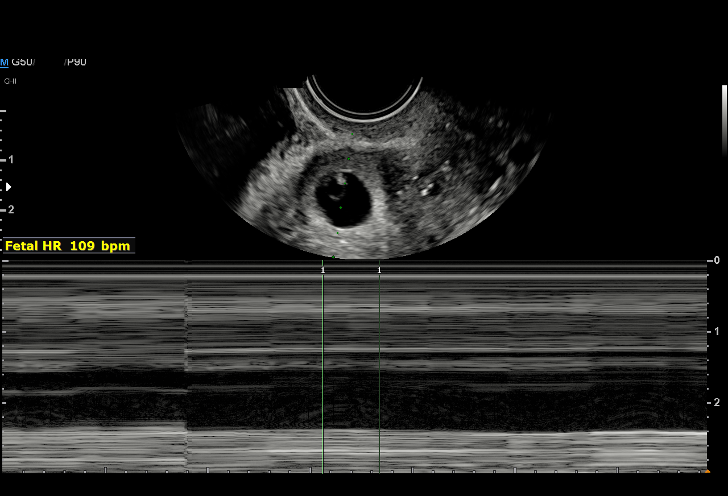
[im 20/30]
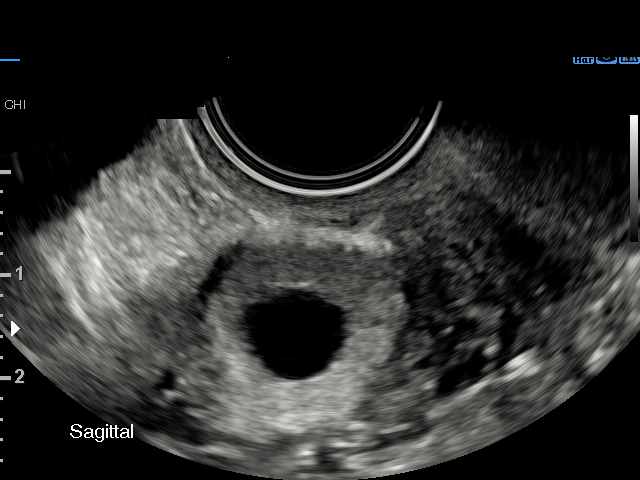
[im 22/30]
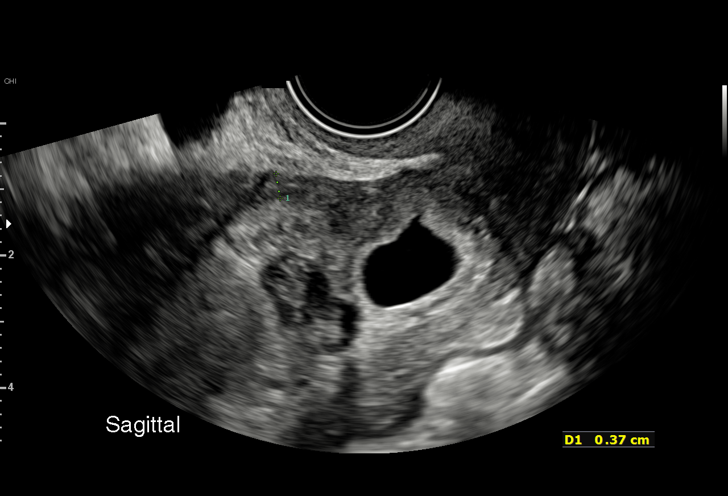
[im 24/30]
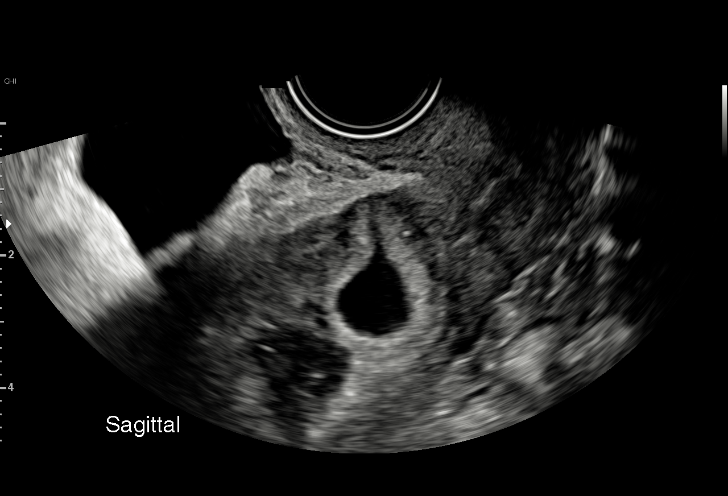
[im 26/30]
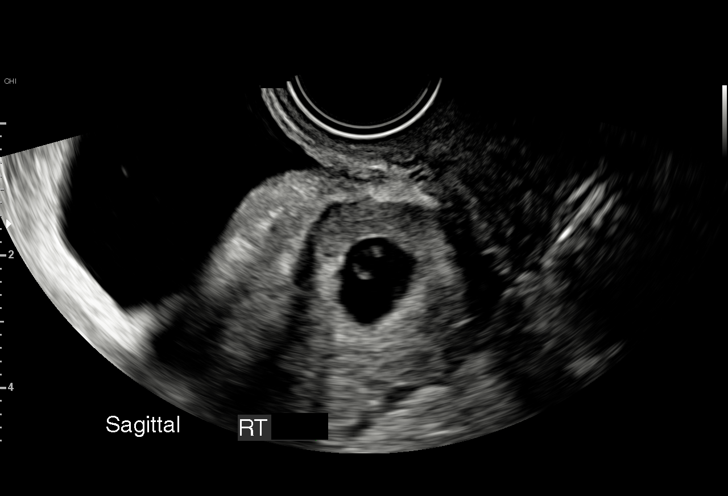
[im 28/30]
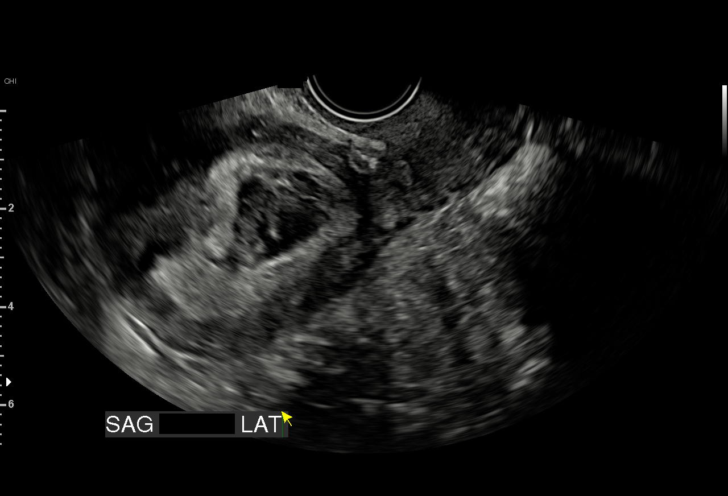

[13 of 28 positions shown; findings below may reference images not displayed]

IMPRESSION: Cesarean scar pregnancy.
 xxxxxxxxxxxxxxxxxxxxxxxxxxxxxxxxxxxxxxxxxxxxxxxxxx
 Consultation (see [REDACTED] )
 I had the pleasure of seeing Ms. Roberto today at the
 [HOSPITAL]. She was accompanied by her
 husband. She is G2 P1 at 5w 6d gestation with suspected
 cesarean scar pregnancy.

 Patient has been having intermittent vaginal bleeding.  At
 your office ultrasound, cesarean scar pregnancy was
 suspected.

 Obstetric history significant for a term cesarean delivery
 (failure to progress in labor) of a male infant weighing 8
 pounds 9 ounces at birth.  Pregnancy conceived after IVF.

 Past medical history: Type 2 diabetes patient takes
 metformin.  Recent hemoglobin level was 5.9%.  She does
 not have hypertension or any other chronic medical
 conditions.
 Allergies: No known drug allergies.
 Prenatal course: This is a natural conception.

 Our concerns include:
 Cesarean scar pregnancy
 -Findings strongly raise the possibility of cesarean scar
 pregnancy.  Ideal time to diagnose condition will be between
 6- and 8-weeks' gestation when placenta is being formed.
 -Differential diagnosis include intrauterine pregnancy (low
 possibility).
 -I discussed the natural course of cesarean scar pregnancy.
 Complications include severe hemorrhage, placenta accreta
 spectrum (PAS). Uterine rupture in later gestations have
 been reported.
 -Expectant management leads to hemorrhagic complications
 in more than 50% of cases and can lead to hysterectomy.
 -Most cases evolve into PAS. I discussed PAS and that it
 usually leads to hysterectomy at delivery.
 Management of cesarean scar pregnancy
 -Vacuum aspiration (suction curettage) under ultrasound
 guidance with laparoscopic/laparotomy preparations showed
 good results with lower complication rate
 -Intragestational injection of methotrexate (or less commonly,
 potassium chloride) followed by vacuum aspiration has also
 shown to be associated with fewer complications.
 -Systemic methotrexate alone has shown higher complication
 rates. However, if vacuum aspiration is the mainstay
 management after methotrexate, it should be associated with
 fewer complications.
 Although the diagnosis will be clearer at 7 to 8 weeks'
 gestation, she could experience increased vaginal bleeding in
 the waiting period. Spontaneous loss of scar pregnancy is a
 possibility (10%).
 Recurrence of scar pregnancy is 15% to 20%.

 I [facility] Dr[facility] Lingappa [facility] who had
 performed several procedures (systemic methotrexate and
 vacuum aspiration) with good success.
 If patient decides on definitive treatment, I recommend
 referring to Dr. Lingappa.

 I discussed with Dr. Janaye. She called the patient who
 informed her that she would like to wait for 2 weeks. She is
 aware of the possibility of increased vaginal bleeding
Recommendations

 -Follow-up scan in 2 weeks if patient chooses expectant
 management.
                 [place], [place]

## 2022-09-06 DIAGNOSIS — Z3141 Encounter for fertility testing: Secondary | ICD-10-CM | POA: Diagnosis not present

## 2022-09-09 DIAGNOSIS — Z3141 Encounter for fertility testing: Secondary | ICD-10-CM | POA: Diagnosis not present

## 2022-09-12 DIAGNOSIS — Z3141 Encounter for fertility testing: Secondary | ICD-10-CM | POA: Diagnosis not present

## 2022-09-15 DIAGNOSIS — Z3183 Encounter for assisted reproductive fertility procedure cycle: Secondary | ICD-10-CM | POA: Diagnosis not present

## 2022-09-15 DIAGNOSIS — N979 Female infertility, unspecified: Secondary | ICD-10-CM | POA: Diagnosis not present

## 2022-09-28 ENCOUNTER — Other Ambulatory Visit: Payer: Self-pay

## 2022-10-02 IMAGING — XA IR EMBO ART  VEN HEMORR LYMPH EXTRAV  INC GUIDE ROADMAPPING
13 of 24 series · 13 of 24 positions shown · IV contrast (IODINE)
Comparison: none

INDICATION: 34-year-old female with uterine hemorrhage, status post dilation and
curettage, presents for angiogram and possible embolization

[Series 1: body 4 care · 1 of 6 slices shown (1 of 10)]
[im 1/6]
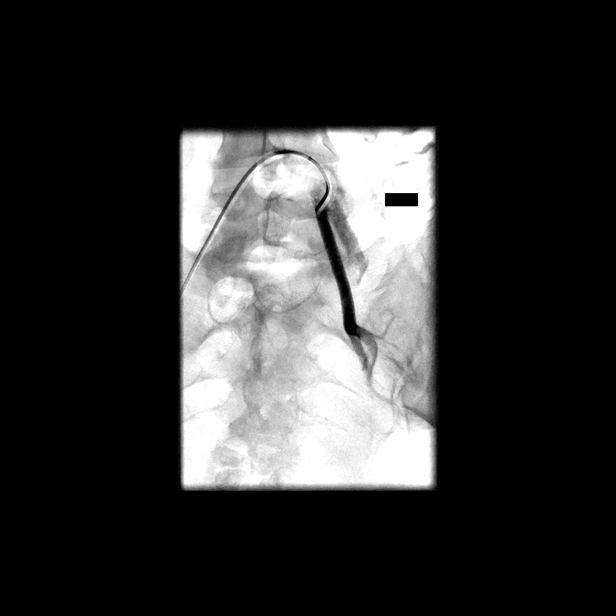

[Series 3: body 4 care · 1 of 11 slices shown (2 of 10)]
[im 1/11]
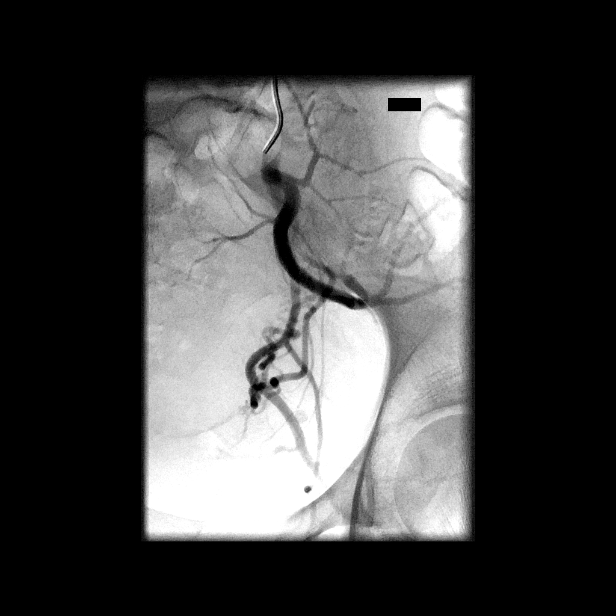

[Series 5: rm(-) angio · 1 of 1 slices shown]
[im 1/1]
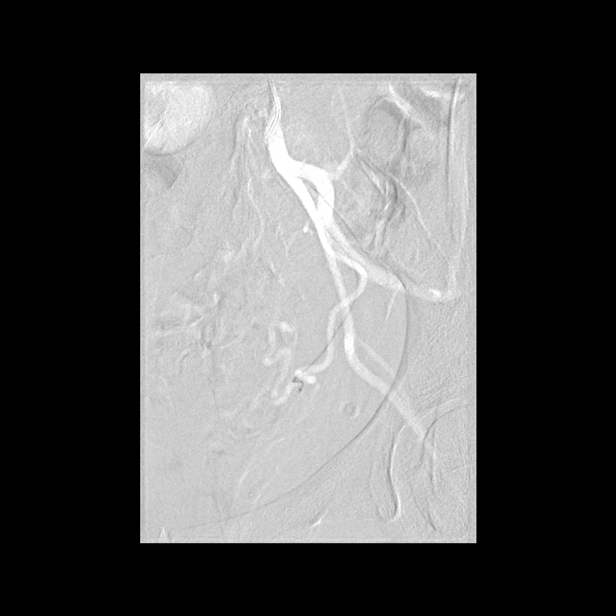

[Series 7: fl (-) angio · 1 of 8 slices shown]
[im 1/8]
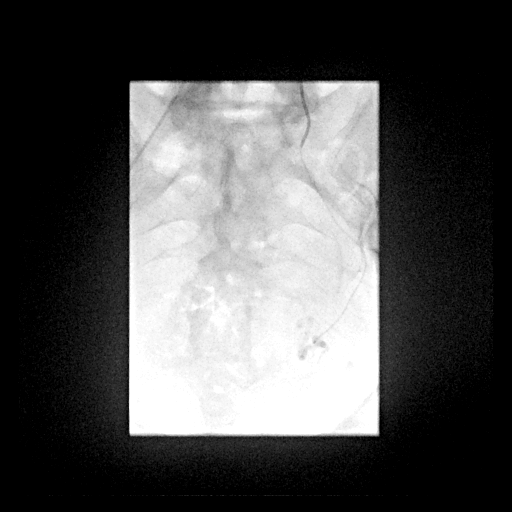

[Series 9: body 4 care · 1 of 18 slices shown (3 of 10)]
[im 1/18]
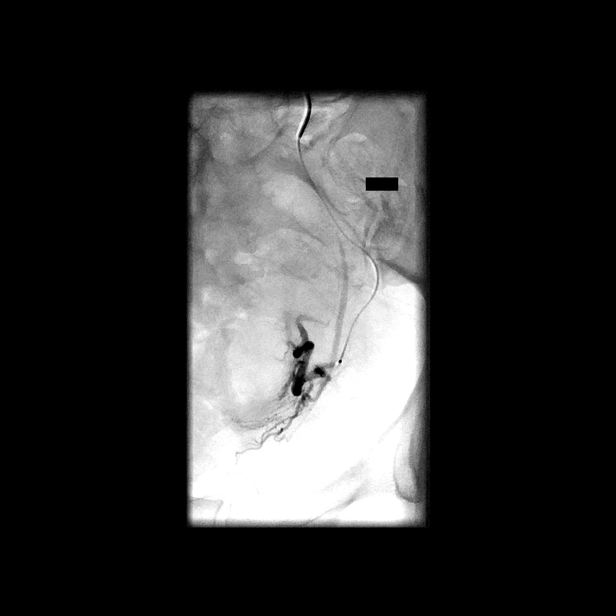

[Series 11: body 4 care · 1 of 8 slices shown (4 of 10)]
[im 1/8]
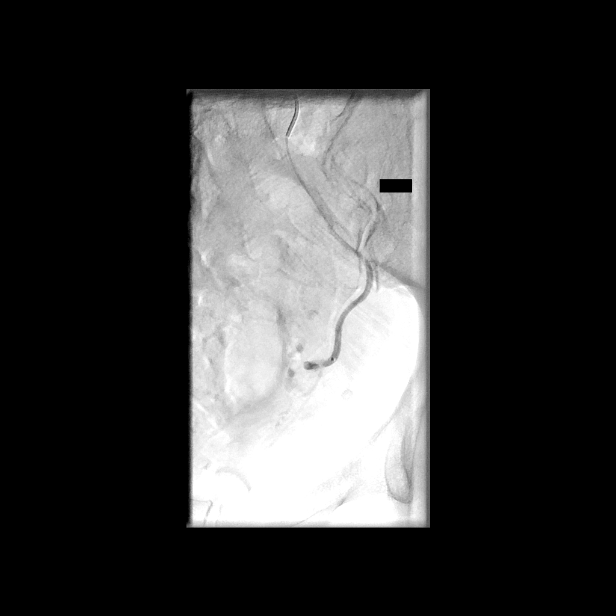

[Series 13: body 4 care · 1 of 7 slices shown (5 of 10)]
[im 1/7]
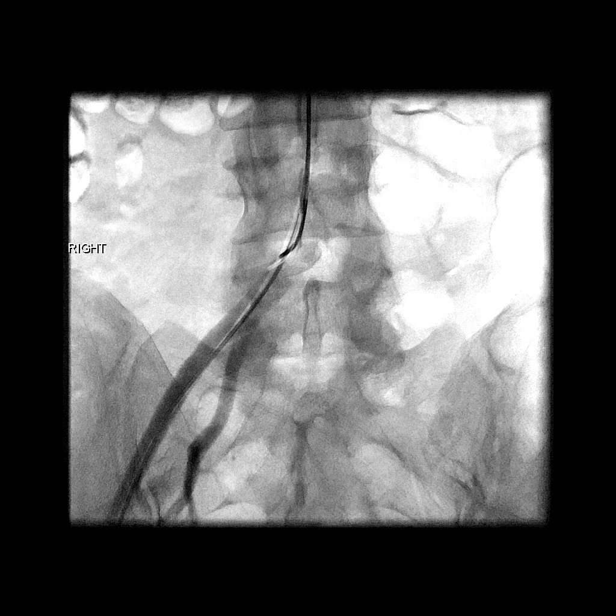

[Series 14: body 4 care · 1 of 18 slices shown (6 of 10)]
[im 1/18]
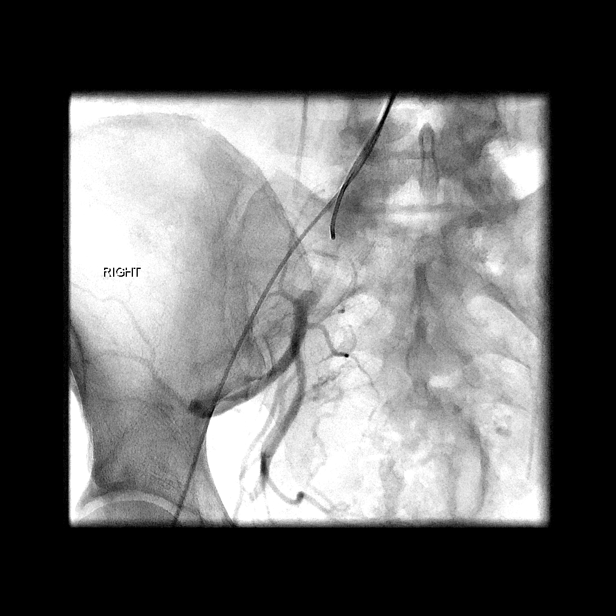

[Series 16: body 4 care · 1 of 7 slices shown (7 of 10)]
[im 1/7]
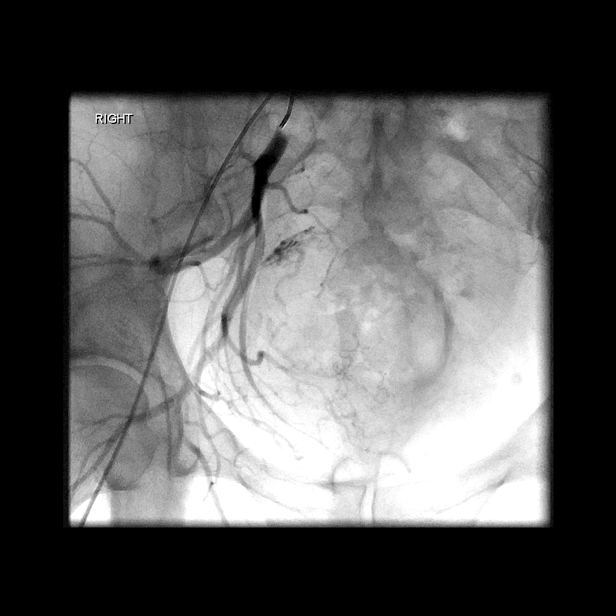

[Series 18: body 4 care · 1 of 12 slices shown (8 of 10)]
[im 1/12]
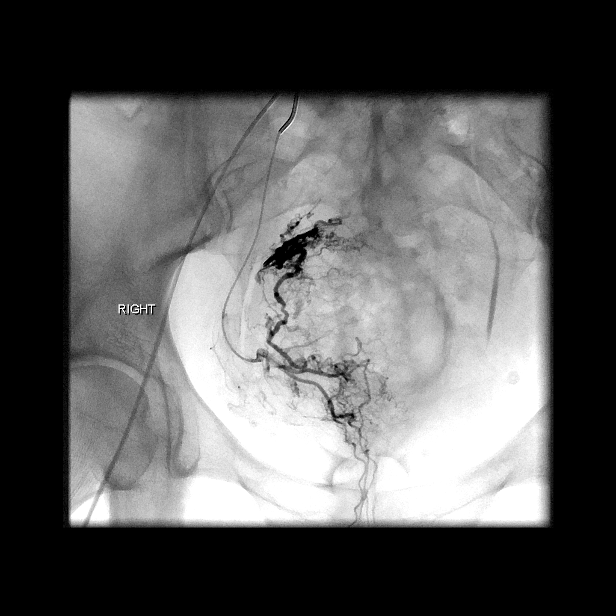

[Series 20: single · 1 of 1 slices shown]
[im 1/1]
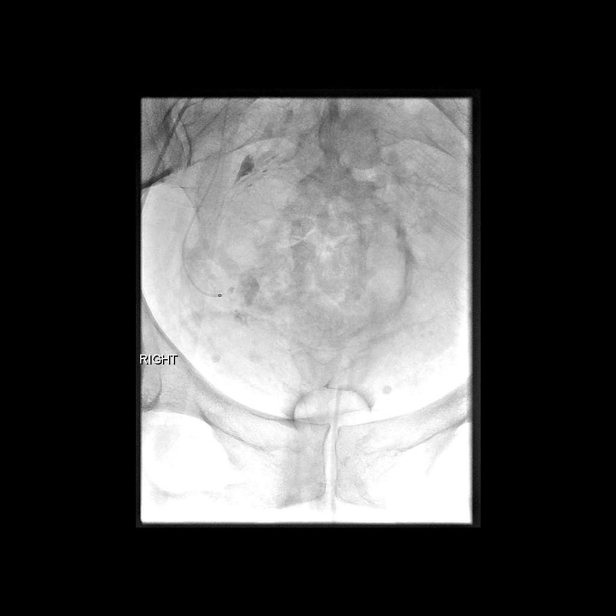

[Series 22: body 4 care · 1 of 12 slices shown (9 of 10)]
[im 1/12]
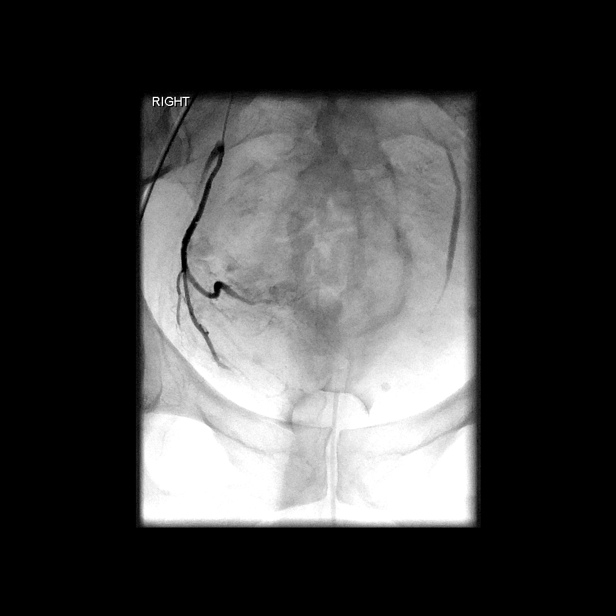

[Series 24: body 4 care · 1 of 7 slices shown (10 of 10)]
[im 1/7]
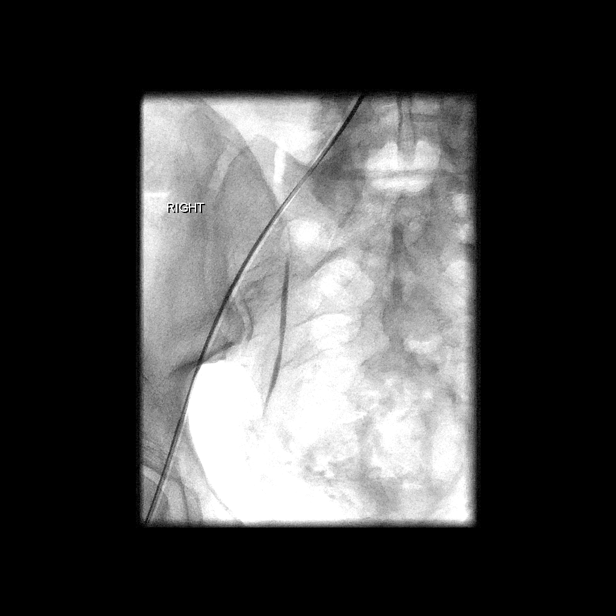

[13 of 24 positions shown; findings below may reference images not displayed]

EXAM:
ULTRASOUND-GUIDED ACCESS RIGHT COMMON FEMORAL ARTERY

PELVIC ANGIOGRAM

BILATERAL UTERINE ARTERY EMBOLIZATION

HEMOSTATIC DEVICE DEPLOYED AT THE RIGHT COMMON FEMORAL ARTERY

MEDICATIONS:
None

ANESTHESIA/SEDATION:
Moderate (conscious) sedation was employed during this procedure. A
total of Versed 3.5 mg and Fentanyl 175 mcg was administered
intravenously.

Moderate Sedation Time: 66 minutes. The patient's level of
consciousness and vital signs were monitored continuously by
radiology nursing throughout the procedure under my direct
supervision.

CONTRAST:  100 cc

FLUOROSCOPY TIME:  Fluoroscopy Time: 19 minutes 15 seconds (688
mGy).

COMPLICATIONS:
None

PROCEDURE:
The procedure, risks, benefits, and alternatives were explained to
the patient. Questions regarding the procedure were encouraged and
answered. The patient understands and consents to the procedure.

The right groin was prepped with Betadine in a sterile fashion, and
a sterile drape was applied covering the operative field. A sterile
gown and sterile gloves were used for the procedure. Local
anesthesia was provided with 1% Lidocaine.

Ultrasound survey of the right inguinal region was performed with
images stored and sent to PACs.

The skin and subcutaneous tissue generously infiltrated with 1%
lidocaine for local anesthesia, and a micropuncture needle was
advanced under ultrasound guidance into the right common femoral
artery. With excellent blood flow returned, micro wire was advanced.
The needle was removed and a micropuncture set was advanced over the
micro wire. The inner dilator in the stylet were removed, and an 035
Bentson wire was advanced into the abdominal aorta. The micro sheath
was removed, a 5 French vascular sheath was placed into the common
femoral artery. The dilator was removed and the sheath was flushed.

A 5 French Cobra catheter was advanced over the Bentson wire and
used to select the left common iliac artery. The Cobra catheter was
advanced into the internal iliac artery.

Angiogram of the left hypogastric artery was performed for
identification of the left uterine artery. Once we identified the
origin, a high-flow Renegade micro catheter was advanced to the
Cobra catheter with a 0.014 Fathom wire. The micro catheter and
micro wire combination were used to select the uterine artery.

Angiogram was performed.

Once we confirmed position the micro catheter within the distal
descending left uterine artery, embolization was performed.
Embolization of the left uterine artery was performed Gel-Foam
slurry. Embolization was performed to stasis.

The the micro wire/micro catheter were removed, and the Cobra
catheter and Bentson wire were used to form Christian Roberth Siu loop. The
[place] loop was then used to select the ipsilateral hypogastric
artery.

Angiogram was performed to confirm position and identified the
origin of the urine artery. Once we confirmed the origin, the same
micro catheter and micro wire combination were used to select the
uterine artery. The catheter was advanced to the distal descending
right uterine artery. Angiogram was performed to confirm position.

Embolization of the right uterine artery was performed with Gel-Foam
slurry. Stasis was achieved.

The [place] loop was reduced via the left common iliac artery with
the assistance of the Bentson wire, and then the catheter and wire
were removed.

Angiogram of the right common femoral artery was performed.

An Exoseal device was deployed for hemostasis.

The patient tolerated the procedure well and remained
hemodynamically stable throughout.

No complications were encountered and no significant blood loss was
encountered.
IMPRESSION: Status post ultrasound guided access right common femoral artery for
pelvic angiogram and Gel-Foam embolization of the bilateral uterine
arteries to stasis.

## 2022-10-17 ENCOUNTER — Other Ambulatory Visit (HOSPITAL_COMMUNITY): Payer: Self-pay

## 2022-10-17 ENCOUNTER — Encounter (HOSPITAL_COMMUNITY): Payer: Self-pay

## 2022-10-17 ENCOUNTER — Other Ambulatory Visit: Payer: Self-pay

## 2022-10-20 ENCOUNTER — Encounter: Payer: Self-pay | Admitting: Family Medicine

## 2022-10-21 NOTE — Telephone Encounter (Signed)
Completed physician part of form.  Placed in CMA work basket for completion. Please call patient and advise her she does need to pick this up because it needs her signature.  Also let her know we provide a copy of her physical from August, this needs to be turned in with the form her instructions at the bottom of that page.

## 2022-10-25 DIAGNOSIS — Z0279 Encounter for issue of other medical certificate: Secondary | ICD-10-CM

## 2023-01-02 ENCOUNTER — Telehealth: Payer: Self-pay | Admitting: Family Medicine

## 2023-01-02 NOTE — Telephone Encounter (Signed)
Okay with me to transfer. 

## 2023-01-02 NOTE — Telephone Encounter (Signed)
Patient is requesting to transfer care from Dr. Claiborne Billings to Dr. Mardelle Matte. States appointment times available and work times are too different to work out. Is this okay?

## 2023-01-03 ENCOUNTER — Other Ambulatory Visit: Payer: Self-pay

## 2023-01-09 ENCOUNTER — Other Ambulatory Visit: Payer: Self-pay

## 2023-01-30 ENCOUNTER — Ambulatory Visit: Payer: 59 | Admitting: Family Medicine

## 2023-02-02 ENCOUNTER — Ambulatory Visit: Payer: 59 | Admitting: Family

## 2023-02-05 ENCOUNTER — Emergency Department (HOSPITAL_BASED_OUTPATIENT_CLINIC_OR_DEPARTMENT_OTHER)
Admission: EM | Admit: 2023-02-05 | Discharge: 2023-02-05 | Disposition: A | Discharge status: Home / Self Care | Payer: 59 | Attending: Emergency Medicine | Admitting: Emergency Medicine

## 2023-02-05 ENCOUNTER — Encounter (HOSPITAL_BASED_OUTPATIENT_CLINIC_OR_DEPARTMENT_OTHER): Payer: Self-pay | Admitting: Emergency Medicine

## 2023-02-05 ENCOUNTER — Other Ambulatory Visit: Payer: Self-pay

## 2023-02-05 ENCOUNTER — Emergency Department (HOSPITAL_BASED_OUTPATIENT_CLINIC_OR_DEPARTMENT_OTHER): Payer: 59

## 2023-02-05 DIAGNOSIS — N132 Hydronephrosis with renal and ureteral calculous obstruction: Secondary | ICD-10-CM | POA: Diagnosis not present

## 2023-02-05 DIAGNOSIS — Z794 Long term (current) use of insulin: Secondary | ICD-10-CM | POA: Insufficient documentation

## 2023-02-05 DIAGNOSIS — K7689 Other specified diseases of liver: Secondary | ICD-10-CM | POA: Diagnosis not present

## 2023-02-05 DIAGNOSIS — N23 Unspecified renal colic: Secondary | ICD-10-CM

## 2023-02-05 DIAGNOSIS — R109 Unspecified abdominal pain: Secondary | ICD-10-CM | POA: Diagnosis not present

## 2023-02-05 DIAGNOSIS — E119 Type 2 diabetes mellitus without complications: Secondary | ICD-10-CM | POA: Insufficient documentation

## 2023-02-05 DIAGNOSIS — N201 Calculus of ureter: Secondary | ICD-10-CM | POA: Diagnosis not present

## 2023-02-05 LAB — URINALYSIS, ROUTINE W REFLEX MICROSCOPIC
Bilirubin Urine: NEGATIVE
Glucose, UA: NEGATIVE mg/dL
Ketones, ur: NEGATIVE mg/dL
Leukocytes,Ua: NEGATIVE
Nitrite: NEGATIVE
Protein, ur: 30 mg/dL — AB
Specific Gravity, Urine: 1.043 — ABNORMAL HIGH (ref 1.005–1.030)
pH: 5.5 (ref 5.0–8.0)

## 2023-02-05 LAB — COMPREHENSIVE METABOLIC PANEL
ALT: 17 U/L (ref 0–44)
AST: 13 U/L — ABNORMAL LOW (ref 15–41)
Albumin: 4.6 g/dL (ref 3.5–5.0)
Alkaline Phosphatase: 45 U/L (ref 38–126)
Anion gap: 10 (ref 5–15)
BUN: 14 mg/dL (ref 6–20)
CO2: 20 mmol/L — ABNORMAL LOW (ref 22–32)
Calcium: 9.1 mg/dL (ref 8.9–10.3)
Chloride: 105 mmol/L (ref 98–111)
Creatinine, Ser: 0.63 mg/dL (ref 0.44–1.00)
GFR, Estimated: >60 mL/min (ref >60)
Glucose, Bld: 136 mg/dL — ABNORMAL HIGH (ref 70–99)
Potassium: 4.3 mmol/L (ref 3.5–5.1)
Sodium: 135 mmol/L (ref 135–145)
Total Bilirubin: 0.4 mg/dL (ref 0.3–1.2)
Total Protein: 7.3 g/dL (ref 6.5–8.1)

## 2023-02-05 LAB — LIPASE, BLOOD: Lipase: 17 U/L (ref 11–51)

## 2023-02-05 LAB — CBC
HCT: 41.2 % (ref 36.0–46.0)
Hemoglobin: 14 g/dL (ref 12.0–15.0)
MCH: 29.2 pg (ref 26.0–34.0)
MCHC: 34 g/dL (ref 30.0–36.0)
MCV: 86 fL (ref 80.0–100.0)
Platelets: 193 10*3/uL (ref 150–400)
RBC: 4.79 MIL/uL (ref 3.87–5.11)
RDW: 12.1 % (ref 11.5–15.5)
WBC: 4.9 10*3/uL (ref 4.0–10.5)
nRBC: 0 % (ref 0.0–0.2)

## 2023-02-05 LAB — HCG, SERUM, QUALITATIVE: Preg, Serum: NEGATIVE

## 2023-02-05 MED ORDER — ONDANSETRON 4 MG PO TBDP: 4.0000 mg | ORAL_TABLET | Freq: Three times a day (TID) | ORAL | 0 refills | Status: DC | PRN

## 2023-02-05 MED ORDER — TAMSULOSIN HCL 0.4 MG PO CAPS: 0.4000 mg | ORAL_CAPSULE | Freq: Every day | ORAL | 0 refills | Status: DC

## 2023-02-05 MED ORDER — SODIUM CHLORIDE 0.9 % IV BOLUS
1000.0000 mL | Freq: Once | INTRAVENOUS | Status: AC
  Administered 2023-02-05: 1000 mL via INTRAVENOUS

## 2023-02-05 MED ORDER — OXYCODONE HCL 5 MG PO TABS: 5.0000 mg | ORAL_TABLET | Freq: Four times a day (QID) | ORAL | 0 refills | Status: DC | PRN

## 2023-02-05 MED ORDER — KETOROLAC TROMETHAMINE 15 MG/ML IJ SOLN
15.0000 mg | Freq: Once | INTRAMUSCULAR | Status: AC
  Administered 2023-02-05: 15 mg via INTRAVENOUS
  Filled 2023-02-05: qty 1

## 2023-02-05 MED ORDER — ONDANSETRON HCL 4 MG/2ML IJ SOLN
4.0000 mg | Freq: Once | INTRAMUSCULAR | Status: AC
  Administered 2023-02-05: 4 mg via INTRAVENOUS
  Filled 2023-02-05: qty 2

## 2023-02-05 NOTE — ED Provider Notes (Signed)
[place] EMERGENCY DEPARTMENT AT [facility] Provider Note   CSN: [id number] Arrival date & time: 02/05/23  4098     History  Chief Complaint  Patient presents with   Flank Pain   Abdominal Pain    Melinda Castillo is a 37 y.o. female.  Patient with history of type 2 diabetes, history of cholecystectomy, surgery for ectopic pregnancy --presents to the emergency department for evaluation of left-sided flank pain and vomiting.  Patient started with some aching cramping pain in the left pelvis area.  This morning she had more severe pain that radiated up into the left flank with associated vomiting.  Her urine has been dark.  She has had increased urgency.  No fevers.  No right-sided abdominal pain.  No vaginal bleeding or discharge. The onset of this condition was acute. The course is constant. Aggravating factors: none. Alleviating factors: none.         Home Medications Prior to Admission medications   Medication Sig Start Date End Date Taking? Authorizing Provider  Cetirizine HCl (ZYRTEC ALLERGY) 10 MG CAPS Take 10 mg by mouth daily as needed.    [provider]  Cholecalciferol (D 5000) 125 MCG (5000 UT) capsule Take 1 capsule (5,000 Units total) by mouth daily. 07/15/22     Follitropin Alfa (GONAL-F RFF REDIJECT) 900 UNIT/1.5ML SOPN Inject 0.5 ml (300 Units) into the skin daily. 07/16/22     Ganirelix Acetate 250 MCG/0.5ML SOSY Inject 250 mcg into the skin daily. 07/16/22     Insulin Syringe-Needle U-100 30G X 5/16" 0.5 ML MISC Use daily as directed 07/16/22     Menotropins (MENOPUR) 75 units SOLR Inject 150 Units into the skin daily. 07/16/22     metFORMIN (GLUCOPHAGE-XR) 750 MG 24 hr tablet Take 1 tablet (750 mg total) by mouth 2 (two) times daily. 07/26/22   Kuneff, Renee A, DO  Multiple Vitamin (MULTIVITAMIN ADULT PO) Take by mouth daily.    [provider]  NEEDLE, DISP, 30 G (BD DISP NEEDLES) 30G X 1/2" MISC Use as directed for injection daily.  07/16/22     Somatropin (ZOMACTON) 10 MG SOLR Dilute with 1ml prefilled diluent and inject 10 units daily 07/16/22     SYRINGE-NEEDLE, DISP, 3 ML (B-D 3CC LUER-LOK SYR 18GX1-1/2) 18G X 1-1/2" 3 ML MISC Use daily as directed 07/16/22         Allergies    Patient has no known allergies.    Review of Systems   Review of Systems  Physical Exam Updated Vital Signs BP (!) 146/107 (BP Location: Right Arm)   Pulse 88   Temp 98.4 F (36.9 C)   Resp 16   Ht 5\' 10"  (1.778 m)   Wt 108 kg   SpO2 100%   BMI 34.15 kg/m  Physical Exam Vitals and nursing note reviewed.  Constitutional:      General: She is not in acute distress.    Appearance: She is well-developed.  HENT:     Head: Normocephalic and atraumatic.     Right Ear: External ear normal.     Left Ear: External ear normal.     Nose: Nose normal.  Eyes:     Conjunctiva/sclera: Conjunctivae normal.  Cardiovascular:     Rate and Rhythm: Normal rate and regular rhythm.     Heart sounds: No murmur heard. Pulmonary:     Effort: No respiratory distress.     Breath sounds: No wheezing, rhonchi or rales.  Abdominal:  Palpations: Abdomen is soft.     Tenderness: There is abdominal tenderness in the left lower quadrant. There is no guarding or rebound. Negative signs include Murphy's sign and McBurney's sign.  Musculoskeletal:     Cervical back: Normal range of motion and neck supple.     Right lower leg: No edema.     Left lower leg: No edema.  Skin:    General: Skin is warm and dry.     Findings: No rash.  Neurological:     General: No focal deficit present.     Mental Status: She is alert. Mental status is at baseline.     Motor: No weakness.  Psychiatric:        Mood and Affect: Mood normal.     ED Results / Procedures / Treatments   Labs (all labs ordered are listed, but only abnormal results are displayed) Labs Reviewed  COMPREHENSIVE METABOLIC PANEL - Abnormal; Notable for the following components:      Result  Value   CO2 20 (*)    Glucose, Bld 136 (*)    AST 13 (*)    All other components within normal limits  URINALYSIS, ROUTINE W REFLEX MICROSCOPIC - Abnormal; Notable for the following components:   APPearance HAZY (*)    Specific Gravity, Urine 1.043 (*)    Hgb urine dipstick TRACE (*)    Protein, ur 30 (*)    Bacteria, UA RARE (*)    All other components within normal limits  LIPASE, BLOOD  CBC  HCG, SERUM, QUALITATIVE    EKG None  Radiology CT Renal Stone Study  Result Date: 02/05/2023 CLINICAL DATA:  Abdominal and flank pain EXAM: CT ABDOMEN AND PELVIS WITHOUT CONTRAST TECHNIQUE: Multidetector CT imaging of the abdomen and pelvis was performed following the standard protocol without IV contrast. RADIATION DOSE REDUCTION: This exam was performed according to the departmental dose-optimization program which includes automated exposure control, adjustment of the mA and/or kV according to patient size and/or use of iterative reconstruction technique. COMPARISON:  04/04/2022 right upper quadrant abdominal sonogram FINDINGS: Lower chest: No significant pulmonary nodules or acute consolidative airspace disease. Hepatobiliary: Normal liver size. Simple 1.3 cm right liver dome cyst posteriorly. No additional liver lesions. Cholecystectomy. No biliary ductal dilatation. Pancreas: Normal, with no mass or duct dilation. Spleen: Normal size. No mass. Adrenals/Urinary Tract: Normal adrenals. No right renal stones. No right hydronephrosis. Obstructing 3 mm distal left pelvic ureteral stone located approximately 2 cm above the left ureterovesical junction with mild left hydroureteronephrosis. No stones in the renal collecting systems. Normal caliber right ureter. No additional ureteral stones. Normal bladder. Stomach/Bowel: Normal non-distended stomach. Normal caliber small bowel with no small bowel wall thickening. Normal appendix. Normal large bowel with no diverticulosis, large bowel wall thickening or  pericolonic fat stranding. Vascular/Lymphatic: Normal caliber abdominal aorta. No pathologically enlarged lymph nodes in the abdomen or pelvis. Reproductive: Grossly normal uterus. No right adnexal mass. Left adnexal 3.8 cm simple cyst (series 2/image 76). Other: No pneumoperitoneum. Trace free fluid in the pelvis. No focal fluid collection. Musculoskeletal: No aggressive appearing focal osseous lesions. Moderate degenerative disc disease at L5-S1. IMPRESSION: 1. Obstructing 3 mm distal left pelvic ureteral stone with mild left hydroureteronephrosis. 2. Left adnexal 3.8 cm simple cyst. No follow-up imaging is recommended. Reference: JACR 2020 Feb;17(2):248-254 3. Trace free fluid in the pelvis, nonspecific, probably physiologic. Electronically Signed   By: Delbert Phenix M.D.   On: 02/05/2023 09:43    Procedures Procedures  Medications Ordered in ED Medications  ketorolac (TORADOL) 15 MG/ML injection 15 mg (has no administration in time range)  ondansetron (ZOFRAN) injection 4 mg (has no administration in time range)  sodium chloride 0.9 % bolus 1,000 mL (has no administration in time range)    ED Course/ Medical Decision Making/ A&P    Patient seen and examined. History obtained directly from patient.   Labs/EKG: Ordered CBC, CMP, lipase, UA, pregnancy ordered in triage.  Labs personally reviewed and interpreted including CBC which is unremarkable with normal white blood cell count and hemoglobin; UA with blood, not compelling for infection; pregnancy negative.  CMP and lipase are pending.  Imaging: Ordered renal protocol CT.  Medications/Fluids: Ordered: Toradol, Zofran, fluid bolus.   Most recent vital signs reviewed and are as follows: BP (!) 146/107 (BP Location: Right Arm)   Pulse 88   Temp 98.4 F (36.9 C)   Resp 16   Ht 5\' 10"  (1.778 m)   Wt 108 kg   SpO2 100%   BMI 34.15 kg/m   Initial impression: Left-sided flank pain and hematuria    Reassessment performed. Patient  appears stable.  Labs personally reviewed and interpreted including:CMP shows normal kidney function and electrolytes otherwise unremarkable; lipase normal.  Imaging personally visualized and interpreted including: CT renal protocol, agree left pelvic ureteral stone noted.  No other acute findings.  Reviewed pertinent lab work and imaging with patient at bedside. Questions answered.   Plan: Awaiting reassessment after pain control and IV fluids.  11:00 AM Reassessment performed. Patient appears stable.  She states that she feels improved.  She is comfortable with discharged home to control symptoms there.  Reviewed pertinent lab work and imaging with patient at bedside. Questions answered.   Most current vital signs reviewed and are as follows: BP (!) 146/107 (BP Location: Right Arm)   Pulse 88   Temp 98.4 F (36.9 C)   Resp 16   Ht 5\' 10"  (1.778 m)   Wt 108 kg   SpO2 100%   BMI 34.15 kg/m   Plan: Discharge to home.   Patient counseled on kidney stone treatment. Urged patient to strain urine and save any stones. Urged urology follow-up and return to [facility] with any complications. Counseled patient to maintain good fluid intake.   Counseled patient on use of Flomax.   Patient counseled on use of narcotic pain medications. Counseled not to combine these medications with others containing tylenol. Urged not to drink alcohol, drive, or perform any other activities that requires focus while taking these medications. The patient verbalizes understanding and agrees with the plan.                                Medical Decision Making Amount and/or Complexity of Data Reviewed Labs: ordered. Radiology: ordered.  Risk Prescription drug management.   For this patient's complaint of abdominal pain, the following conditions were considered on the differential diagnosis: gastritis/PUD, enteritis/duodenitis, appendicitis, cholelithiasis/cholecystitis, cholangitis, pancreatitis, ruptured  viscus, colitis, diverticulitis, small/large bowel obstruction, proctitis, cystitis, pyelonephritis, ureteral colic, aortic dissection, aortic aneurysm. In women, ectopic pregnancy, pelvic inflammatory disease, ovarian cysts, and tubo-ovarian abscess were also considered. Atypical chest etiologies were also considered including ACS, PE, and pneumonia.  The patient's vital signs, pertinent lab work and imaging were reviewed and interpreted as discussed in the ED course. Hospitalization was considered for further testing, treatments, or serial exams/observation. However as patient is well-appearing, has a  stable exam, and reassuring studies today, I do not feel that they warrant admission at this time. This plan was discussed with the patient who verbalizes agreement and comfort with this plan and seems reliable and able to return to the Emergency Department with worsening or changing symptoms.          Final Clinical Impression(s) / ED Diagnoses Final diagnoses:  Ureteral colic    Rx / DC Orders ED Discharge Orders          Ordered    tamsulosin (FLOMAX) 0.4 MG CAPS capsule  Daily        02/05/23 1057    ondansetron (ZOFRAN-ODT) 4 MG disintegrating tablet  Every 8 hours PRN        02/05/23 1057    oxyCODONE (OXY IR/ROXICODONE) 5 MG immediate release tablet  Every 6 hours PRN        02/05/23 1057              Renne Crigler, PA-C 02/05/23 1101    Derwood Kaplan, MD 02/06/23 [phone]

## 2023-02-05 NOTE — ED Triage Notes (Signed)
Pt arrives to ED with c/o flank pain that radiates to left lower abdomin and pelvis. Pt notes urinary urgency.

## 2023-02-05 NOTE — Discharge Instructions (Signed)
Please read and follow all provided instructions.  Your diagnoses today include:  1. Ureteral colic     Tests performed today include: Urine test that showed blood in your urine and no infection CT scan which showed a 3 millimeter kidney stone on the left side Blood test that showed normal kidney function Vital signs. See below for your results today.   Medications prescribed:  Oxycodone - narcotic pain medication  DO NOT drive or perform any activities that require you to be awake and alert because this medicine can make you drowsy.   Zofran (ondansetron) - for nausea and vomiting  Flomax (tamsulosin) - relaxes smooth muscle to help kidney stones pass  Take any prescribed medications only as directed.  Home care instructions:  Follow any educational materials contained in this packet.  Please double your fluid intake for the next several days. Strain your urine and save any stones that may pass.   BE VERY CAREFUL not to take multiple medicines containing Tylenol (also called acetaminophen). Doing so can lead to an overdose which can damage your liver and cause liver failure and possibly death.   Follow-up instructions: Please follow-up with your urologist or the urologist referral (provided on front page) in the next 1 week for further evaluation of your symptoms.  Return instructions:  Please return to the Emergency Department if you experience worsening symptoms.  Please return if you develop fever or uncontrolled pain or vomiting. Please return if you have any other emergent concerns.  Additional Information:  Your vital signs today were: BP (!) 146/107 (BP Location: Right Arm)   Pulse 88   Temp 98.4 F (36.9 C)   Resp 16   Ht 5\' 10"  (1.778 m)   Wt 108 kg   SpO2 100%   BMI 34.15 kg/m  If your blood pressure (BP) was elevated above 135/85 this visit, please have this repeated by your doctor within one month. --------------

## 2023-02-09 ENCOUNTER — Encounter (INDEPENDENT_AMBULATORY_CARE_PROVIDER_SITE_OTHER): Payer: Self-pay

## 2023-02-13 ENCOUNTER — Encounter: Payer: 59 | Admitting: Family Medicine

## 2023-02-21 ENCOUNTER — Telehealth: Payer: 59 | Admitting: Physician Assistant

## 2023-02-21 DIAGNOSIS — L03119 Cellulitis of unspecified part of limb: Secondary | ICD-10-CM | POA: Diagnosis not present

## 2023-02-21 MED ORDER — HYDROXYZINE PAMOATE 25 MG PO CAPS: 25.0000 mg | ORAL_CAPSULE | Freq: Three times a day (TID) | ORAL | 0 refills | Status: DC | PRN

## 2023-02-21 MED ORDER — CEPHALEXIN 500 MG PO CAPS
500.0000 mg | ORAL_CAPSULE | Freq: Four times a day (QID) | ORAL | 0 refills | Status: AC
Start: 2023-02-21 — End: 2023-02-26

## 2023-02-21 NOTE — Progress Notes (Signed)
E Visit for Cellulitis  We are sorry that you are not feeling well. Here is how we plan to help!  Based on what you shared with me it looks like you have a secondary cellulitis of the skin.  You can also get delayed allergic reactions but giving appearance I am more concerned for start of cellulitis. Cellulitis looks like areas of skin redness, swelling, and warmth; it develops as a result of bacteria entering under the skin. Little red spots and/or bleeding can be seen in skin, and tiny surface sacs containing fluid can occur. Fever can be present. Cellulitis is almost always on one side of a body, and the lower limbs are the most common site of involvement.   I have prescribed:  Keflex 500mg  take one by mouth four times a day for 5 days. I am also sending in a script for hydroxyzine to take to help reduce itch and inflammation.  HOME CARE:  Take your medications as ordered and take all of them, even if the skin irritation appears to be healing.   GET HELP RIGHT AWAY IF:  Symptoms that don't begin to go away within 48 hours. Severe redness persists or worsens If the area turns color, spreads or swells. If it blisters and opens, develops yellow-brown crust or bleeds. You develop a fever or chills. If the pain increases or becomes unbearable.  Are unable to keep fluids and food down.  MAKE SURE YOU   Understand these instructions. Will watch your condition. Will get help right away if you are not doing well or get worse.  Thank you for choosing an e-visit.  Your e-visit answers were reviewed by a board certified advanced clinical practitioner to complete your personal care plan. Depending upon the condition, your plan could have included both over the counter or prescription medications.  Please review your pharmacy choice. Make sure the pharmacy is open so you can pick up prescription now. If there is a problem, you may contact your provider through [employer] and have the  prescription routed to another pharmacy.  Your safety is important to [place]. If you have drug allergies check your prescription carefully.   For the next 24 hours you can use MyChart to ask questions about today's visit, request a non-urgent call back, or ask for a work or school excuse. You will get an email in the next two days asking about your experience. I hope that your e-visit has been valuable and will speed your recovery.

## 2023-02-21 NOTE — Progress Notes (Signed)
I have spent 5 minutes in review of e-visit questionnaire, review and updating patient chart, medical decision making and response to patient.   William Cody Martin, PA-C    

## 2023-03-09 DIAGNOSIS — Z1151 Encounter for screening for human papillomavirus (HPV): Secondary | ICD-10-CM | POA: Diagnosis not present

## 2023-03-09 DIAGNOSIS — Z01411 Encounter for gynecological examination (general) (routine) with abnormal findings: Secondary | ICD-10-CM | POA: Diagnosis not present

## 2023-03-09 DIAGNOSIS — Z130 Encounter for screening for diseases of the blood and blood-forming organs and certain disorders involving the immune mechanism: Secondary | ICD-10-CM | POA: Diagnosis not present

## 2023-03-09 DIAGNOSIS — R6882 Decreased libido: Secondary | ICD-10-CM | POA: Diagnosis not present

## 2023-03-09 DIAGNOSIS — Z1389 Encounter for screening for other disorder: Secondary | ICD-10-CM | POA: Diagnosis not present

## 2023-03-09 DIAGNOSIS — Z124 Encounter for screening for malignant neoplasm of cervix: Secondary | ICD-10-CM | POA: Diagnosis not present

## 2023-03-10 ENCOUNTER — Other Ambulatory Visit (HOSPITAL_COMMUNITY): Payer: Self-pay

## 2023-03-10 MED ORDER — ZEPBOUND 2.5 MG/0.5ML ~~LOC~~ SOAJ
2.5000 mg | SUBCUTANEOUS | 0 refills | Status: DC
  Filled 2023-03-10: qty 2, 28d supply, fill #0

## 2023-03-14 LAB — HM PAP SMEAR: HPV, high-risk: NEGATIVE

## 2023-03-17 LAB — HM PAP SMEAR

## 2023-03-22 ENCOUNTER — Ambulatory Visit (INDEPENDENT_AMBULATORY_CARE_PROVIDER_SITE_OTHER): Payer: 59 | Admitting: Family Medicine

## 2023-03-22 ENCOUNTER — Other Ambulatory Visit (HOSPITAL_COMMUNITY): Payer: Self-pay

## 2023-03-22 ENCOUNTER — Encounter: Payer: Self-pay | Admitting: Family Medicine

## 2023-03-22 ENCOUNTER — Other Ambulatory Visit: Payer: Self-pay

## 2023-03-22 VITALS — BP 124/74 | HR 78 | Temp 98.2°F | Ht 70.0 in | Wt 242.4 lb

## 2023-03-22 DIAGNOSIS — E669 Obesity, unspecified: Secondary | ICD-10-CM

## 2023-03-22 DIAGNOSIS — E559 Vitamin D deficiency, unspecified: Secondary | ICD-10-CM | POA: Diagnosis not present

## 2023-03-22 DIAGNOSIS — E1169 Type 2 diabetes mellitus with other specified complication: Secondary | ICD-10-CM | POA: Diagnosis not present

## 2023-03-22 DIAGNOSIS — Z7984 Long term (current) use of oral hypoglycemic drugs: Secondary | ICD-10-CM | POA: Diagnosis not present

## 2023-03-22 DIAGNOSIS — E119 Type 2 diabetes mellitus without complications: Secondary | ICD-10-CM | POA: Diagnosis not present

## 2023-03-22 DIAGNOSIS — Z8759 Personal history of other complications of pregnancy, childbirth and the puerperium: Secondary | ICD-10-CM | POA: Insufficient documentation

## 2023-03-22 DIAGNOSIS — J301 Allergic rhinitis due to pollen: Secondary | ICD-10-CM

## 2023-03-22 DIAGNOSIS — E049 Nontoxic goiter, unspecified: Secondary | ICD-10-CM

## 2023-03-22 DIAGNOSIS — Z0001 Encounter for general adult medical examination with abnormal findings: Secondary | ICD-10-CM

## 2023-03-22 DIAGNOSIS — N979 Female infertility, unspecified: Secondary | ICD-10-CM

## 2023-03-22 LAB — COMPREHENSIVE METABOLIC PANEL
ALT: 21 U/L (ref 0–35)
AST: 17 U/L (ref 0–37)
Albumin: 4.6 g/dL (ref 3.5–5.2)
Alkaline Phosphatase: 47 U/L (ref 39–117)
BUN: 15 mg/dL (ref 6–23)
CO2: 29 mEq/L (ref 19–32)
Calcium: 9.5 mg/dL (ref 8.4–10.5)
Chloride: 103 mEq/L (ref 96–112)
Creatinine, Ser: 0.69 mg/dL (ref 0.40–1.20)
GFR: 109.73 mL/min (ref >60.00)
Glucose, Bld: 162 mg/dL — ABNORMAL HIGH (ref 70–99)
Potassium: 4.1 mEq/L (ref 3.5–5.1)
Sodium: 140 mEq/L (ref 135–145)
Total Bilirubin: 0.3 mg/dL (ref 0.2–1.2)
Total Protein: 7 g/dL (ref 6.0–8.3)

## 2023-03-22 LAB — TSH: TSH: 1.05 u[IU]/mL (ref 0.35–5.50)

## 2023-03-22 LAB — MICROALBUMIN / CREATININE URINE RATIO
Creatinine,U: 56.3 mg/dL
Microalb Creat Ratio: 1.2 mg/g (ref 0.0–30.0)
Microalb, Ur: <0.7 mg/dL (ref 0.0–1.9)

## 2023-03-22 LAB — VITAMIN D 25 HYDROXY (VIT D DEFICIENCY, FRACTURES): VITD: 45.42 ng/mL (ref 30.00–100.00)

## 2023-03-22 LAB — LIPID PANEL
Cholesterol: 149 mg/dL (ref 0–200)
HDL: 48 mg/dL (ref >39.00)
LDL Cholesterol: 73 mg/dL (ref 0–99)
NonHDL: 100.55
Total CHOL/HDL Ratio: 3
Triglycerides: 138 mg/dL (ref 0.0–149.0)
VLDL: 27.6 mg/dL (ref 0.0–40.0)

## 2023-03-22 LAB — HEMOGLOBIN A1C: Hgb A1c MFr Bld: 6.3 % (ref 4.6–6.5)

## 2023-03-22 MED ORDER — METFORMIN HCL ER 750 MG PO TB24
750.0000 mg | ORAL_TABLET | Freq: Two times a day (BID) | ORAL | 1 refills | Status: DC
Start: 2023-03-22 — End: 2023-06-26
  Filled 2023-03-22: qty 180, 90d supply, fill #0

## 2023-03-22 MED ORDER — TIRZEPATIDE 2.5 MG/0.5ML ~~LOC~~ SOAJ
2.5000 mg | SUBCUTANEOUS | 1 refills | Status: DC
Start: 2023-03-22 — End: 2023-04-27
  Filled 2023-03-22: qty 2, 28d supply, fill #0

## 2023-03-22 NOTE — Progress Notes (Signed)
Subjective  Chief Complaint  Patient presents with   Transitions Of Care    Pt here to establish care from [facility], pap smear done by Arby Barrette, NP last week, Eye Exam scheduled Dr.Freeman on 05/21/23    HPI: Melinda Castillo is a 37 y.o. female who presents to [facility] [place] today for a Female Wellness Visit.  She also has the concerns and/or needs as listed above in the chief complaint. These will be addressed in addition to the Health Maintenance Visit.   Wellness Visit: annual visit with health maintenance review and exam HM: pap current and normal. Sees gyn. Doing well. Eating healthy and exercises: hard to lose weight. Will get flu vaccine at work. Eye exam: due.  Chronic disease management visit and/or acute problem visit: Type 2 diabetes: has been controlled on metformin. Some loose stools. Failed ozempic 2 week trial due to urgent diarrhea. Would like to consider mounjaro. Has struggled with obesity entire life. Mom with same. No h/o PCOS. No h/o thyroid disease. No retinopathy or neuropathy Allergies are controlled Vit d deficiency due for recheck No thyroid obstructive sxs. No h/o imaging of thyroid.  Infertility: has one son, now 65 yo, IVF pregnancy. Had spontaneous pregnancy: ectopic. Has failed several rounds of IVF since. Considering adoption. Regular menses.    Assessment  1. Encounter for well adult exam with abnormal findings   2. Type 2 diabetes mellitus with obesity (HCC)   3. Seasonal allergic rhinitis due to pollen   4. Diabetes mellitus treated with oral medication (HCC)   5. Vitamin D deficiency   6. Enlarged thyroid   7. Infertility, female      Plan  Female Wellness Visit: Age appropriate Health Maintenance and Prevention measures were discussed with patient. Included topics are cancer screening recommendations, ways to keep healthy (see AVS) including dietary and exercise recommendations, regular eye and dental  care, use of seat belts, and avoidance of moderate alcohol use and tobacco use. Pap current. Due eye exam BMI: discussed patient's BMI and encouraged positive lifestyle modifications to help get to or maintain a target BMI. HM needs and immunizations were addressed and ordered. See below for orders. See HM and immunization section for updates. Routine labs and screening tests ordered including cmp, cbc and lipids where appropriate. Discussed recommendations regarding Vit D and calcium supplementation (see AVS)  Chronic disease f/u and/or acute problem visit: (deemed necessary to be done in addition to the wellness visit): DM w/ obesity:  start mounjaro. Educated on possible side effects. She will let me know how she does with 2.5mg  dose. Can stop metformin IF loose stools occur. Continue diet. Exercise. Eye exam. Check urine nephropahty screen. Not on ace. Not on statin; has naturally low LDL.  Allergies prn antihistamines Recheck vit d Rec ultrasound to assess for nodules of thyroid. Nl function by recent labs Infertility. No longer trying.   Follow up: 3 mo for recheck diabetes and weight loss  Orders Placed This Encounter  Procedures   US THYROID   HM PAP SMEAR   Comprehensive metabolic panel   Lipid panel   Hemoglobin A1c   TSH   Microalbumin / creatinine urine ratio   VITAMIN D 25 Hydroxy (Vit-D Deficiency, Fractures)   Meds ordered this encounter  Medications   metFORMIN (GLUCOPHAGE-XR) 750 MG 24 hr tablet    Sig: Take 1 tablet (750 mg total) by mouth 2 (two) times daily.    Dispense:  180  tablet    Refill:  1   tirzepatide (MOUNJARO) 2.5 MG/0.5ML Pen    Sig: Inject 2.5 mg into the skin once a week.    Dispense:  2 mL    Refill:  1       Body mass index is 34.78 kg/m. Wt Readings from Last 3 Encounters:  03/22/23 242 lb 6.4 oz (110 kg)  02/05/23 238 lb (108 kg)  07/26/22 250 lb 3.2 oz (113.5 kg)   Need for contraception: No,   Patient Active Problem List    Diagnosis Date Noted Date Diagnosed   Seasonal allergic rhinitis due to pollen 03/22/2023    History of pregnancy induced hypertension 03/22/2023    Infertility, female 02/28/2019     Formatting of this note might be different from the original. IVF pregnancy through Dr Elesa Hacker Day 5 embryo transfer. PGS-A testing completed    Type 2 diabetes mellitus with obesity (HCC) 11/02/2016    Vitamin D deficiency 08/22/2011     Formatting of this note might be different from the original. Rx sent Vitamin D 50,000IU once weekly x 16 weeks 08/22/11    Health Maintenance  Topic Date Due   OPHTHALMOLOGY EXAM  05/10/2022   HEMOGLOBIN A1C  08/07/2022   INFLUENZA VACCINE  09/25/2023 (Originally 01/26/2023)   Diabetic kidney evaluation - Urine ACR  07/27/2023   Diabetic kidney evaluation - eGFR measurement  02/05/2024   FOOT EXAM  03/21/2024   Cervical Cancer Screening (HPV/Pap Cotest)  03/16/2026   DTaP/Tdap/Td (2 - Td or Tdap) 08/01/2029   Hepatitis C Screening  Completed   HIV Screening  Completed   HPV VACCINES  Discontinued   COVID-19 Vaccine  Discontinued   Immunization History  Administered Date(s) Administered   HPV Quadrivalent 01/29/2009   Hepatitis B 03/08/2011   Influenza Split 03/28/2013   Influenza, Quadrivalent, Recombinant, Inj, Pf 03/28/2019   Influenza, Seasonal, Injecte, Preservative Fre 03/27/2018, 03/28/2019   Influenza-Unspecified 03/27/2020, 03/27/2021   MMR 05/14/1994   PFIZER(Purple Top)SARS-COV-2 Vaccination 06/29/2019, 07/20/2019, 03/26/2020   Pneumococcal Polysaccharide-23 07/22/2020   Rho (D) Immune Globulin 07/15/2019, 09/26/2019   Tdap 08/02/2019   We updated and reviewed the patient's past history in detail and it is documented below. Allergies: Patient  reports current alcohol use of about 1.0 standard drink of alcohol per week. Past Medical History Patient  has a past medical history of Chronic hypertension affecting pregnancy, History of bilateral breast  reduction surgery (01/06/2012), History of ectopic pregnancy (01/2021), History of positive PPD (2010), History of pregnancy induced hypertension, Infertility, female (02/28/2019), Pelvic hematoma, female, S/P dilatation and curettage (03/01/2021), Type 2 diabetes mellitus (HCC), and Wears contact lenses. Past Surgical History Patient  has a past surgical history that includes Breast reduction surgery (Bilateral, 2013); Tonsillectomy (2006); Cesarean section (09/25/2019); Colonoscopy with esophagogastroduodenoscopy (egd) (07/2018); IR US Guide Vasc Access Right (03/02/2021); IR EMBO ART  VEN HEMORR LYMPH EXTRAV  INC GUIDE ROADMAPPING (03/02/2021); IR EMBO ART  VEN HEMORR LYMPH EXTRAV  INC GUIDE ROADMAPPING (03/02/2021); IR Angiogram Pelvis Selective Or Supraselective (03/03/2021); IR Angiogram Selective Each Additional Vessel (03/03/2021); IR Angiogram Selective Each Additional Vessel (03/03/2021); Dilation and curettage of uterus (N/A, 03/01/2021); Robotic assisted laparoscopic lysis of adhesion (N/A, 06/16/2021); Breast surgery (06/13); Dilatation and curettage/hysteroscopy with minerva (11/2021); and Cholecystectomy (N/A, 04/11/2022). 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     Education/employment: Animator, works as Scientist, clinical (histocompatibility and immunogenetics) at [employer].    Safety:      -Wears a bicycle helmet riding a bike: Yes     -smoke alarm in the home:Yes     - wears seatbelt: Yes     - Feels safe in their relationships: Yes   Social Determinants of Health   Financial Resource Strain: Low Risk  (11/02/2021)   Overall Financial Resource Strain (CARDIA)    Difficulty of Paying Living Expenses: Not hard at all  Food Insecurity: No Food Insecurity (11/02/2021)   Hunger Vital Sign    Worried About Running Out of Food in the Last Year: Never true    Ran Out of Food in the Last Year: Never true  Transportation Needs: No Transportation Needs (11/02/2021)   PRAPARE - Administrator, Civil Service (Medical): No    Lack of Transportation (Non-Medical): No  Physical Activity: Insufficiently Active (11/02/2021)   Exercise Vital Sign    Days of Exercise per Week: 3 days    Minutes of Exercise per Session: 30 min  Stress: No Stress Concern Present (11/02/2021)   [employer] of Occupational Health - Occupational Stress Questionnaire    Feeling of Stress : Only a little  Social Connections: Moderately Integrated (11/02/2021)   Social Connection and Isolation Panel [NHANES]    Frequency of Communication with Friends and Family: More than three times a week    Frequency of Social Gatherings with Friends and Family: Once a week    Attends Religious Services: Never    Database administrator or Organizations: Yes    Attends Engineer, structural: 1 to 4 times per year    Marital Status: Married   Family History  Problem Relation Age of Onset   Hypertension Mother    Hypertension Father    Ovarian cancer Maternal Grandmother    Early death Maternal Grandmother    Heart disease Maternal Grandfather    Stroke Paternal Grandmother    Brain cancer Paternal Grandfather    Colon cancer Neg Hx    Colon polyps Neg Hx     Review of Systems: Constitutional: negative for fever or  malaise Ophthalmic: negative for photophobia, double vision or loss of vision Cardiovascular: negative for chest pain, dyspnea on exertion, or new LE swelling Respiratory: negative for SOB or persistent cough Gastrointestinal: negative for abdominal pain, change in bowel habits or melena Genitourinary: negative for dysuria or gross hematuria, no abnormal uterine bleeding or disharge Musculoskeletal: negative for new gait disturbance or muscular weakness Integumentary: negative for new or persistent rashes, no breast lumps Neurological: negative for TIA or stroke symptoms Psychiatric: negative for SI or delusions Allergic/Immunologic: negative for hives  Patient Care Team  Relationship Specialty Notifications Start End  Willow Ora, MD PCP - General Family Medicine  03/22/23   Raynald Blend, OD  Optometry  07/22/20   Marnette Burgess, MD  Gastroenterology  07/22/20   Lenice Llamas, MD Referring Physician Reproductive Endocrinology and Infertility  03/22/23   Arby Barrette, FNP  Obstetrics and Gynecology  03/22/23     Objective  Vitals: BP 124/74   Pulse 78   Temp 98.2 F (36.8 C)   Ht 5\' 10"  (1.778 m)   Wt 242 lb 6.4 oz (110 kg)   SpO2 97%   BMI 34.78 kg/m  General:  Well developed, well nourished, no acute distress  Psych:  Alert and orientedx3,normal mood and affect HEENT:  Normocephalic, atraumatic, non-icteric sclera, PERRL, supple neck without adenopathy, mass + L>R  thyromegaly w/o dominant nodule or ttp Cardiovascular:  Normal S1, S2, RRR without gallop, rub or murmur Respiratory:  Good breath sounds bilaterally, CTAB with normal respiratory effort Gastrointestinal: normal bowel sounds, soft, non-tender, no noted masses. No HSM MSK: no deformities, contusions. Joints are without erythema or swelling.  Skin:  Warm, no rashes or suspicious lesions noted Neurologic:    Mental status is normal. Gross motor and sensory exams are normal. Normal gait. No  tremor    Commons side effects, risks, benefits, and alternatives for medications and treatment plan prescribed today were discussed, and the patient expressed understanding of the given instructions. Patient is instructed to call or message via MyChart if he/she has any questions or concerns regarding our treatment plan. No barriers to understanding were identified. We discussed Red Flag symptoms and signs in detail. Patient expressed understanding regarding what to do in case of urgent or emergency type symptoms.  Medication list was reconciled, printed and provided to the patient in AVS. Patient instructions and summary information was reviewed with the patient as documented in the AVS. This note was prepared with assistance of Dragon voice recognition software. Occasional wrong-word or sound-a-like substitutions may have occurred due to the inherent limitations of voice recognition software .

## 2023-03-23 NOTE — Progress Notes (Signed)
See mychart note Dear Ms. Siemen, It was good seeing you yesterday.  I am glad you are feeling well.  Your lab test results all look good.  Your sugar was elevated but the overall control remains good with an A1c at 6.3.  Mounjaro should help with weight loss and sugar control.  Cholesterol levels are very good as expected.  No medication changes are needed at this time. Sincerely, Dr. Mardelle Matte

## 2023-03-29 ENCOUNTER — Other Ambulatory Visit (HOSPITAL_COMMUNITY): Payer: Self-pay

## 2023-04-05 ENCOUNTER — Encounter: Payer: Self-pay | Admitting: Family Medicine

## 2023-04-10 ENCOUNTER — Telehealth: Payer: Self-pay

## 2023-04-10 ENCOUNTER — Other Ambulatory Visit: Payer: Self-pay

## 2023-04-10 ENCOUNTER — Other Ambulatory Visit (HOSPITAL_COMMUNITY): Payer: Self-pay

## 2023-04-10 NOTE — Telephone Encounter (Signed)
Pharmacy Patient Advocate Encounter   Received notification from Patient Advice Request messages that prior authorization for Mounjaro 2.5MG /0.5ML auto-injectors is required/requested.   Insurance verification completed.   The patient is insured through [facility] .   Per test claim: PA required; PA submitted to [facility] via CoverMyMeds Key/confirmation #/EOC [id number] Status is pending

## 2023-04-10 NOTE — Telephone Encounter (Signed)
Pharmacy Patient Advocate Encounter  Received notification from [facility] that Prior Authorization for [facility] ([facility]) 2.5MG /0.5ML auto-injectors has been APPROVED from 04/10/23 to 04/09/24. Ran test claim, Copay is $0. This test claim was processed through [facility]- copay amounts may vary at other pharmacies due to pharmacy/plan contracts, or as the patient moves through the different stages of their insurance plan.   PA #/Case ID/Reference #: [id number]

## 2023-04-14 ENCOUNTER — Ambulatory Visit
Admission: RE | Admit: 2023-04-14 | Discharge: 2023-04-14 | Disposition: A | Discharge status: Home / Self Care | Payer: 59 | Source: Ambulatory Visit | Attending: Family Medicine | Admitting: Family Medicine

## 2023-04-14 DIAGNOSIS — E049 Nontoxic goiter, unspecified: Secondary | ICD-10-CM

## 2023-04-14 DIAGNOSIS — R221 Localized swelling, mass and lump, neck: Secondary | ICD-10-CM | POA: Diagnosis not present

## 2023-04-17 NOTE — Progress Notes (Signed)
See mychart note Dear Ms. Seekins, Your thyroid appears normal. It is normal size and there are no nodules.  Nothing further is needed.  Sincerely, Dr. Mardelle Matte

## 2023-04-27 ENCOUNTER — Other Ambulatory Visit (HOSPITAL_COMMUNITY): Payer: Self-pay

## 2023-04-27 ENCOUNTER — Other Ambulatory Visit: Payer: Self-pay

## 2023-04-27 MED ORDER — TIRZEPATIDE 5 MG/0.5ML ~~LOC~~ SOAJ
5.0000 mg | SUBCUTANEOUS | 2 refills | Status: DC
  Filled 2023-04-27 – 2023-05-03 (×2): qty 2, 28d supply, fill #0
  Filled 2023-05-28: qty 2, 28d supply, fill #1
  Filled 2023-06-27: qty 2, 28d supply, fill #2

## 2023-05-03 ENCOUNTER — Other Ambulatory Visit: Payer: Self-pay

## 2023-05-03 ENCOUNTER — Other Ambulatory Visit (HOSPITAL_COMMUNITY): Payer: Self-pay

## 2023-05-29 ENCOUNTER — Other Ambulatory Visit: Payer: Self-pay

## 2023-06-08 LAB — HM DIABETES EYE EXAM

## 2023-06-26 ENCOUNTER — Ambulatory Visit (INDEPENDENT_AMBULATORY_CARE_PROVIDER_SITE_OTHER): Payer: 59 | Admitting: Family Medicine

## 2023-06-26 ENCOUNTER — Encounter: Payer: Self-pay | Admitting: Family Medicine

## 2023-06-26 VITALS — BP 140/104 | HR 78 | Temp 97.7°F | Ht 70.0 in | Wt 240.0 lb

## 2023-06-26 DIAGNOSIS — I158 Other secondary hypertension: Secondary | ICD-10-CM | POA: Diagnosis not present

## 2023-06-26 DIAGNOSIS — E1169 Type 2 diabetes mellitus with other specified complication: Secondary | ICD-10-CM | POA: Diagnosis not present

## 2023-06-26 DIAGNOSIS — J069 Acute upper respiratory infection, unspecified: Secondary | ICD-10-CM

## 2023-06-26 DIAGNOSIS — E669 Obesity, unspecified: Secondary | ICD-10-CM

## 2023-06-26 DIAGNOSIS — Z7984 Long term (current) use of oral hypoglycemic drugs: Secondary | ICD-10-CM | POA: Diagnosis not present

## 2023-06-26 DIAGNOSIS — T50905A Adverse effect of unspecified drugs, medicaments and biological substances, initial encounter: Secondary | ICD-10-CM

## 2023-06-26 LAB — POCT GLYCOSYLATED HEMOGLOBIN (HGB A1C): Hemoglobin A1C: 5.8 % — AB (ref 4.0–5.6)

## 2023-06-26 NOTE — Progress Notes (Signed)
Subjective  CC: No chief complaint on file.   HPI: Melinda Castillo is a 37 y.o. female who presents to the office today for follow up of diabetes and problems listed above in the chief complaint.  Diabetes follow up: Her diabetic control is reported as Improved.  She is taking metformin XR 750 twice daily and now on Mounjaro 5 mg weekly.  Still with some loose stools side effects.  Skips and metformin on the day of the day after her shot to avoid further GI side effects.  Minimal nausea.  However has not lost much weight given the holiday diet changes etc.  She denies exertional CP or SOB or symptomatic hypoglycemia. She denies foot sores or paresthesias.  No history of hypertension, elevated blood pressure in office today, on decongestants for URI.  Several day history of typical cold and cough symptoms.  No fevers or shortness of breath. Obesity: As above, tolerating Mounjaro but has not had significant weight loss at this time.  She is down a few pounds.  Diet due to holidays has been variable.  Little exercise.  Wt Readings from Last 3 Encounters:  06/26/23 240 lb (108.9 kg)  03/22/23 242 lb 6.4 oz (110 kg)  02/05/23 238 lb (108 kg)    BP Readings from Last 3 Encounters:  06/26/23 (!) 140/104  03/22/23 124/74  02/05/23 135/71    Assessment  1. Type 2 diabetes mellitus with obesity (HCC)   2. 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 URI with cough and congestion   4. Severe obesity (BMI 35.0-39.9) with comorbidity (HCC)      Plan  Diabetes is currently very well controlled.  Will stop metformin due to GI side effects and titrate dose of Mounjaro up as tolerated.  Continue 5 mg for 2 months total and then will titrate up.  She will message me.  Diabetes well-controlled, will recheck in 3 months to ensure control is maintained on Mounjaro without the metformin.  We could restart the 750 mg XR dose if needed at that time. Obesity: Work on diet, exercise and titrating up [employer].  Education  given Elevated blood pressure likely due to decongestants and URI.  She will check blood pressures at home at work and let me know in the next month or 2 once her cold symptoms have resolved.  Stop Afrin.  Follow up: 3 months to recheck diabetes and weight Orders Placed This Encounter  Procedures   POCT HgB A1C   No orders of the defined types were placed in this encounter.     Immunization History  Administered Date(s) Administered   HPV Quadrivalent 01/29/2009   Hepatitis B 03/08/2011   Influenza Split 03/28/2013   Influenza, Quadrivalent, Recombinant, Inj, Pf 03/28/2019   Influenza, Seasonal, Injecte, Preservative Fre 03/27/2018, 03/28/2019   Influenza-Unspecified 03/27/2020, 03/27/2021   MMR 05/14/1994   PFIZER(Purple Top)SARS-COV-2 Vaccination 06/29/2019, 07/20/2019, 03/26/2020   Pneumococcal Polysaccharide-23 07/22/2020   Rho (D) Immune Globulin 07/15/2019, 09/26/2019   Tdap 08/02/2019    Diabetes Related Lab Review: Lab Results  Component Value Date   HGBA1C 5.8 (A) 06/26/2023   HGBA1C 6.3 03/22/2023   HGBA1C 7.0 (H) 02/04/2022    Lab Results  Component Value Date   MICROALBUR <0.7 03/22/2023   Lab Results  Component Value Date   CREATININE 0.69 03/22/2023   BUN 15 03/22/2023   NA 140 03/22/2023   K 4.1 03/22/2023   CL 103 03/22/2023   CO2 29 03/22/2023   Lab Results  Component Value Date   CHOL 149 03/22/2023   CHOL 148 02/04/2022   Lab Results  Component Value Date   HDL 48.00 03/22/2023   HDL 41.30 02/04/2022   Lab Results  Component Value Date   LDLCALC 73 03/22/2023   LDLCALC 91 02/04/2022   Lab Results  Component Value Date   TRIG 138.0 03/22/2023   TRIG 80.0 02/04/2022   Lab Results  Component Value Date   CHOLHDL 3 03/22/2023   CHOLHDL 4 02/04/2022   No results found for: "LDLDIRECT" The ASCVD Risk score (Arnett DK, et al., 2019) failed to calculate for the following reasons:   The 2019 ASCVD risk score is only valid for ages 46  to 41 I have reviewed the PMH, Fam and Soc history. Patient Active Problem List   Diagnosis Date Noted Date Diagnosed   Severe obesity (BMI 35.0-39.9) with comorbidity (HCC) 06/26/2023    Seasonal allergic rhinitis due to pollen 03/22/2023    History of pregnancy induced hypertension 03/22/2023    Infertility, female 02/28/2019     Formatting of this note might be different from the original. IVF pregnancy through Dr Elesa Hacker Day 5 embryo transfer. PGS-A testing completed    Type 2 diabetes mellitus with obesity (HCC) 11/02/2016     Tolerates low dose metformin only. Failed ozempic due to urgency and diarrhea; trial of mounjaro 03/2023, stopped metformin xr 1500 daily to push mounjaro dose for diabetic control and weight loss 05/2023    Vitamin D deficiency 08/22/2011     Social History: Patient  reports that she has never smoked. She has never used smokeless tobacco. She reports current alcohol use of about 1.0 standard drink of alcohol per week. She reports that she does not use drugs.  Review of Systems: Ophthalmic: negative for eye pain, loss of vision or double vision Cardiovascular: negative for chest pain Respiratory: negative for SOB or persistent cough Gastrointestinal: negative for abdominal pain Genitourinary: negative for dysuria or gross hematuria MSK: negative for foot lesions Neurologic: negative for weakness or gait disturbance  Objective  Vitals: BP (!) 140/104   Pulse 78   Temp 97.7 F (36.5 C)   Ht 5\' 10"  (1.778 m)   Wt 240 lb (108.9 kg)   SpO2 99%   BMI 34.44 kg/m  General: well appearing, no acute distress  Psych:  Alert and oriented, normal mood and affect HEENT:  Normocephalic, atraumatic, congested Cardiovascular:  Nl S1 and S2, RRR without murmur, gallop or rub. no edema Respiratory:  Good breath sounds bilaterally, CTAB with normal effort, no rales  Diabetic education: ongoing education regarding chronic disease management for diabetes was given  today. We continue to reinforce the ABC's of diabetic management: A1c (<7 or 8 dependent upon patient), tight blood pressure control, and cholesterol management with goal LDL < 100 minimally. We discuss diet strategies, exercise recommendations, medication options and possible side effects. At each visit, we review recommended immunizations and preventive care recommendations for diabetics and stress that good diabetic control can prevent other problems. See below for this patient's data.   Commons side effects, risks, benefits, and alternatives for medications and treatment plan prescribed today were discussed, and the patient expressed understanding of the given instructions. Patient is instructed to call or message via MyChart if he/she has any questions or concerns regarding our treatment plan. No barriers to understanding were identified. We discussed Red Flag symptoms and signs in detail. Patient expressed understanding regarding what to do in case of urgent or  emergency type symptoms.  Medication list was reconciled, printed and provided to the patient in AVS. Patient instructions and summary information was reviewed with the patient as documented in the AVS. This note was prepared with assistance of Dragon voice recognition software. Occasional wrong-word or sound-a-like substitutions may have occurred due to the inherent limitations of voice recognition software

## 2023-06-26 NOTE — Patient Instructions (Signed)
Please return in 3 months for diabetes follow up   If you have any questions or concerns, please don't hesitate to send me a message via MyChart or call the office at [phone]. Thank you for visiting with [place] today! It's our pleasure caring for you.   Please send me a message in about 4 to 6 weeks with blood pressure numbers and let me know how you are tolerating 5 mg Mounjaro dose.  Good luck.

## 2023-06-27 ENCOUNTER — Other Ambulatory Visit (HOSPITAL_COMMUNITY): Payer: Self-pay

## 2023-07-26 ENCOUNTER — Other Ambulatory Visit: Payer: Self-pay | Admitting: Family Medicine

## 2023-07-27 ENCOUNTER — Encounter: Payer: Self-pay | Admitting: Pharmacist

## 2023-07-27 ENCOUNTER — Other Ambulatory Visit: Payer: Self-pay

## 2023-07-27 MED ORDER — MOUNJARO 5 MG/0.5ML ~~LOC~~ SOAJ
5.0000 mg | SUBCUTANEOUS | 2 refills | Status: DC
  Filled 2023-07-27: qty 2, 28d supply, fill #0
  Filled 2023-08-24: qty 2, 28d supply, fill #1
  Filled 2023-09-25: qty 2, 28d supply, fill #2

## 2023-08-24 ENCOUNTER — Other Ambulatory Visit (HOSPITAL_COMMUNITY): Payer: Self-pay

## 2023-08-25 ENCOUNTER — Encounter: Payer: Self-pay | Admitting: Family Medicine

## 2023-09-25 ENCOUNTER — Other Ambulatory Visit (HOSPITAL_COMMUNITY): Payer: Self-pay

## 2023-09-28 ENCOUNTER — Ambulatory Visit (INDEPENDENT_AMBULATORY_CARE_PROVIDER_SITE_OTHER): Payer: 59 | Admitting: Family Medicine

## 2023-09-28 ENCOUNTER — Encounter: Payer: Self-pay | Admitting: Family Medicine

## 2023-09-28 ENCOUNTER — Other Ambulatory Visit (HOSPITAL_COMMUNITY): Payer: Self-pay

## 2023-09-28 VITALS — BP 144/100 | HR 99 | Temp 98.7°F | Ht 70.0 in | Wt 233.8 lb

## 2023-09-28 DIAGNOSIS — E1169 Type 2 diabetes mellitus with other specified complication: Secondary | ICD-10-CM

## 2023-09-28 DIAGNOSIS — E669 Obesity, unspecified: Secondary | ICD-10-CM

## 2023-09-28 DIAGNOSIS — R7989 Other specified abnormal findings of blood chemistry: Secondary | ICD-10-CM

## 2023-09-28 DIAGNOSIS — Z6833 Body mass index (BMI) 33.0-33.9, adult: Secondary | ICD-10-CM | POA: Diagnosis not present

## 2023-09-28 DIAGNOSIS — R030 Elevated blood-pressure reading, without diagnosis of hypertension: Secondary | ICD-10-CM | POA: Diagnosis not present

## 2023-09-28 DIAGNOSIS — Z7985 Long-term (current) use of injectable non-insulin antidiabetic drugs: Secondary | ICD-10-CM

## 2023-09-28 LAB — POCT GLYCOSYLATED HEMOGLOBIN (HGB A1C): Hemoglobin A1C: 5.7 % — AB (ref 4.0–5.6)

## 2023-09-28 MED ORDER — TIRZEPATIDE 7.5 MG/0.5ML ~~LOC~~ SOAJ
7.5000 mg | SUBCUTANEOUS | 2 refills | Status: DC
  Filled 2023-09-28 – 2023-10-23 (×2): qty 2, 28d supply, fill #0
  Filled 2023-11-14 – 2023-11-15 (×3): qty 2, 28d supply, fill #1
  Filled 2023-12-14: qty 2, 28d supply, fill #2

## 2023-09-28 NOTE — Patient Instructions (Signed)
 Please return in 6 months for your annual complete physical; please come fasting.    If you have any questions or concerns, please don't hesitate to send me a message via MyChart or call the office at [phone]. Thank you for visiting with Melinda Castillo today! It's our pleasure caring for you.   VISIT SUMMARY:  Today, we discussed your diabetes management and weight loss progress. Your diabetes is well-controlled with your current medication, and you have made significant lifestyle changes. We also reviewed your blood pressure readings and discussed preventive care measures.  YOUR PLAN:  -TYPE 2 DIABETES MELLITUS: Type 2 diabetes is a condition where your body does not use insulin properly, leading to high blood sugar levels. Your diabetes is well-controlled with Mounjaro, and your hemoglobin A1c is at 5.8%. Continue taking Mounjaro at the increased dose of 7.5 mg. You will be referred to a nutritionist to help optimize your protein intake and support your weight loss efforts. We will monitor your diabetes control with a follow-up in six months, or sooner if needed.  -PREHYPERTENSION: Prehypertension is when your blood pressure is higher than normal but not yet in the high blood pressure range. Your diastolic blood pressure has been slightly elevated. Continue with your lifestyle modifications, and monitor your blood pressure periodically. We will discuss the potential need for antihypertensive medication in the future if your blood pressure increases.  -GENERAL HEALTH MAINTENANCE: For preventive care, you are due for the Prevnar 20 vaccination, which helps protect against pneumonia. Additionally, it is recommended to schedule a mammogram after your 38th birthday in November.  INSTRUCTIONS:  Please continue with your current lifestyle modifications and medication regimen. Monitor your blood pressure regularly and keep a log of your readings. Schedule an appointment for the Prevnar 20 vaccination at your  next visit and plan to have a mammogram after your 38th birthday in November. Follow up in six months for complete physical, or sooner if needed.

## 2023-09-28 NOTE — Progress Notes (Signed)
 Subjective  CC:  Chief Complaint  Patient presents with   Diabetes    HPI: Melinda Castillo is a 38 y.o. female who presents to the office today for follow up of diabetes and problems listed above in the chief complaint.  Diabetes follow up: Her diabetic control is reported as Improved. Doing well on monjuaro 5mg  weekly. No longer on metformin and no longer with GI side effects. Weight is trending downward. She denies exertional CP or SOB or symptomatic hypoglycemia. She denies foot sores or paresthesias. No ace or statin (low LDL, prehypertension) Elevated bp: home readings look better than in office: 120s/80s-90. Feels well. Strong FH of HTN and had gestational HTN. She is exercising, following low salt diet and working on weight loss Obesity: improving on meds and with TLC. Requests nutrition referral.   Wt Readings from Last 3 Encounters:  09/28/23 233 lb 12.8 oz (106.1 kg)  06/26/23 240 lb (108.9 kg)  03/22/23 242 lb 6.4 oz (110 kg)    BP Readings from Last 3 Encounters:  09/28/23  138/88  06/26/23 (!) 140/104  03/22/23 124/74    Assessment  1. Type 2 diabetes mellitus with obesity (HCC)   2. Severe obesity (BMI 35.0-39.9) with comorbidity (HCC)   3. Low serum low density lipoprotein (LDL)   4. Prehypertension      Plan  Diabetes is currently very well controlled. Continue mounjaro and titrate up to 7.5mg  weekly; reassess in 3 months with [employer]. Monitor weight loss; refer for diabetic nutrition education. Due prevnar 20 but we are out in the office today; will give next visit.  Obesity: continue meds and exercise/diet preHTN: at risk for HTN; education given. Follow low salt diet and hopeful for improvement with weight loss and exercise. Will monitor at home and q 6 months.  Low LDL: deferring statin use for now.   Follow up: 6 mo for cpe  Orders Placed This Encounter  Procedures   Referral to Nutrition and Diabetes Services   POCT HgB A1C   Meds ordered  this encounter  Medications   tirzepatide (MOUNJARO) 7.5 MG/0.5ML Pen    Sig: Inject 7.5 mg into the skin once a week.    Dispense:  2 mL    Refill:  2      Immunization History  Administered Date(s) Administered   HPV Quadrivalent 01/29/2009   Hepatitis B 03/08/2011   Influenza Split 03/28/2013   Influenza, Quadrivalent, Recombinant, Inj, Pf 03/28/2019   Influenza, Seasonal, Injecte, Preservative Fre 03/27/2018, 03/28/2019   Influenza-Unspecified 03/27/2020, 03/27/2021   MMR 05/14/1994   PFIZER(Purple Top)SARS-COV-2 Vaccination 06/29/2019, 07/20/2019, 03/26/2020   Pneumococcal Polysaccharide-23 07/22/2020   Rho (D) Immune Globulin 07/15/2019, 09/26/2019   Tdap 08/02/2019    Diabetes Related Lab Review: Lab Results  Component Value Date   HGBA1C 5.7 (A) 09/28/2023   HGBA1C 5.8 (A) 06/26/2023   HGBA1C 6.3 03/22/2023    Lab Results  Component Value Date   MICROALBUR <0.7 03/22/2023   Lab Results  Component Value Date   CREATININE 0.69 03/22/2023   BUN 15 03/22/2023   NA 140 03/22/2023   K 4.1 03/22/2023   CL 103 03/22/2023   CO2 29 03/22/2023   Lab Results  Component Value Date   CHOL 149 03/22/2023   CHOL 148 02/04/2022   Lab Results  Component Value Date   HDL 48.00 03/22/2023   HDL 41.30 02/04/2022   Lab Results  Component Value Date   LDLCALC 73 03/22/2023   LDLCALC  91 02/04/2022   Lab Results  Component Value Date   TRIG 138.0 03/22/2023   TRIG 80.0 02/04/2022   Lab Results  Component Value Date   CHOLHDL 3 03/22/2023   CHOLHDL 4 02/04/2022   No results found for: "LDLDIRECT" The ASCVD Risk score (Arnett DK, et al., 2019) failed to calculate for the following reasons:   The 2019 ASCVD risk score is only valid for ages 71 to 66 I have reviewed the PMH, Fam and Soc history. Patient Active Problem List   Diagnosis Date Noted Date Diagnosed   Low serum low density lipoprotein (LDL) 09/28/2023     Priority: High   Severe obesity (BMI  35.0-39.9) with comorbidity (HCC) 06/26/2023     Priority: High   Type 2 diabetes mellitus with obesity (HCC) 11/02/2016     Priority: High    Tolerates low dose metformin only. Failed ozempic due to urgency and diarrhea; trial of mounjaro 03/2023, stopped metformin xr 1500 daily to push mounjaro dose for diabetic control and weight loss 05/2023 Not on statin: LDL 73.    History of pregnancy induced hypertension 03/22/2023     Priority: Medium    Single live birth 09/25/2019     Priority: Medium     PLTCS on 09/25/2019 for failure to progress/failed IOL (SVE 5cm >12 hours). Patient was mid IOL for cHTN with SI PreE with SF and T2DM. NBM weighing 8lbs 13oz. APGARS 8/9. EBL 900cc and Hemabate x 1 given.    Infertility, female 02/28/2019     Priority: Medium     Formatting of this note might be different from the original. IVF pregnancy through Dr Elesa Hacker Day 5 embryo transfer. PGS-A testing completed    Seasonal allergic rhinitis due to pollen 03/22/2023     Priority: Low   Vitamin D deficiency 08/22/2011     Priority: Low   Prehypertension 08/25/2011     Social History: Patient  reports that she has never smoked. She has never used smokeless tobacco. She reports current alcohol use of about 1.0 standard drink of alcohol per week. She reports that she does not use drugs.  Review of Systems: Ophthalmic: negative for eye pain, loss of vision or double vision Cardiovascular: negative for chest pain Respiratory: negative for SOB or persistent cough Gastrointestinal: negative for abdominal pain Genitourinary: negative for dysuria or gross hematuria MSK: negative for foot lesions Neurologic: negative for weakness or gait disturbance  Objective  Vitals: BP (!) 138/88  Pulse 99   Temp 98.7 F (37.1 C)   Ht 5\' 10"  (1.778 m)   Wt 233 lb 12.8 oz (106.1 kg)   SpO2 100%   BMI 33.55 kg/m  General: well appearing, no acute distress  Psych:  Alert and oriented, normal mood and  affect HEENT:  Normocephalic, atraumatic, moist mucous membranes, supple neck  Cardiovascular:  Nl S1 and S2, RRR without murmur, gallop or rub. no edema Respiratory:  Good breath sounds bilaterally, CTAB with normal effort, no rales Gastrointestinal: normal BS, soft, nontender Skin:  Warm, no rashes Neurologic:   Mental status is normal. normal gait Foot exam: no erythema, pallor, or cyanosis visible nl proprioception and sensation to monofilament testing bilaterally, +2 distal pulses bilaterally    Diabetic education: ongoing education regarding chronic disease management for diabetes was given today. We continue to reinforce the ABC's of diabetic management: A1c (<7 or 8 dependent upon patient), tight blood pressure control, and cholesterol management with goal LDL < 100 minimally. We discuss diet  strategies, exercise recommendations, medication options and possible side effects. At each visit, we review recommended immunizations and preventive care recommendations for diabetics and stress that good diabetic control can prevent other problems. See below for this patient's data.   Commons side effects, risks, benefits, and alternatives for medications and treatment plan prescribed today were discussed, and the patient expressed understanding of the given instructions. Patient is instructed to call or message via MyChart if he/she has any questions or concerns regarding our treatment plan. No barriers to understanding were identified. We discussed Red Flag symptoms and signs in detail. Patient expressed understanding regarding what to do in case of urgent or emergency type symptoms.  Medication list was reconciled, printed and provided to the patient in AVS. Patient instructions and summary information was reviewed with the patient as documented in the AVS. This note was prepared with assistance of Dragon voice recognition software. Occasional wrong-word or sound-a-like substitutions may have  occurred due to the inherent limitations of voice recognition software

## 2023-10-04 ENCOUNTER — Encounter: Attending: Family Medicine | Admitting: Nutrition

## 2023-10-04 VITALS — Ht 70.0 in | Wt 239.0 lb

## 2023-10-04 DIAGNOSIS — R030 Elevated blood-pressure reading, without diagnosis of hypertension: Secondary | ICD-10-CM

## 2023-10-04 DIAGNOSIS — E669 Obesity, unspecified: Secondary | ICD-10-CM | POA: Diagnosis not present

## 2023-10-04 DIAGNOSIS — E1169 Type 2 diabetes mellitus with other specified complication: Secondary | ICD-10-CM | POA: Insufficient documentation

## 2023-10-04 NOTE — Progress Notes (Signed)
 Medical Nutrition Therapy  Appointment Start time:  1530  Appointment End time:  1645 Primary concerns today: Uncontrolled Type 2 DM  Referral diagnosis: E11.8 Preferred learning style: No preference  Learning readiness: Ready   NUTRITION ASSESSMENT  38 yr old wfemale referred by Dr. Jonelle Neri for Type 2 DM and obesity4-9-2 Mounjero 7 months. Lost 7 lbs. Will be going to 7.5 mg next dose change A1C was 7% a year ago. Obesity BMI 34. She is willing to work on commitment to Lifestyle Medicine with a more whole plant based diet.      Latest Ref Rng & Units 03/22/2023    2:29 PM 02/05/2023    8:44 AM 04/04/2022   11:10 AM  CMP  Glucose 70 - 99 mg/dL 010  272  536   BUN 6 - 23 mg/dL 15  14  12    Creatinine 0.40 - 1.20 mg/dL 6.44  0.34  7.42   Sodium 135 - 145 mEq/L 140  135  138   Potassium 3.5 - 5.1 mEq/L 4.1  4.3  4.5   Chloride 96 - 112 mEq/L 103  105  104   CO2 19 - 32 mEq/L 29  20  25    Calcium 8.4 - 10.5 mg/dL 9.5  9.1  9.2   Total Protein 6.0 - 8.3 g/dL 7.0  7.3  8.0   Total Bilirubin 0.2 - 1.2 mg/dL 0.3  0.4  0.5   Alkaline Phos 39 - 117 U/L 47  45  42   AST 0 - 37 U/L 17  13  26    ALT 0 - 35 U/L 21  17  44      Clinical    Latest Ref Rng & Units 03/22/2023    2:29 PM 02/05/2023    8:44 AM 04/04/2022   11:10 AM  CMP  Glucose 70 - 99 mg/dL 595  638  756   BUN 6 - 23 mg/dL 15  14  12    Creatinine 0.40 - 1.20 mg/dL 4.33  2.95  1.88   Sodium 135 - 145 mEq/L 140  135  138   Potassium 3.5 - 5.1 mEq/L 4.1  4.3  4.5   Chloride 96 - 112 mEq/L 103  105  104   CO2 19 - 32 mEq/L 29  20  25    Calcium 8.4 - 10.5 mg/dL 9.5  9.1  9.2   Total Protein 6.0 - 8.3 g/dL 7.0  7.3  8.0   Total Bilirubin 0.2 - 1.2 mg/dL 0.3  0.4  0.5   Alkaline Phos 39 - 117 U/L 47  45  42   AST 0 - 37 U/L 17  13  26    ALT 0 - 35 U/L 21  17  44    Lipid Panel     Component Value Date/Time   CHOL 149 03/22/2023 1429   TRIG 138.0 03/22/2023 1429   HDL 48.00 03/22/2023 1429   CHOLHDL 3 03/22/2023 1429    VLDL 27.6 03/22/2023 1429   LDLCALC 73 03/22/2023 1429   Lab Results  Component Value Date   HGBA1C 5.7 (A) 09/28/2023    Medical Hx:  Past Medical History:  Diagnosis Date   Chronic hypertension affecting pregnancy    History of bilateral breast reduction surgery 01/06/2012   History of ectopic pregnancy 01/2021   treated with medication   History of positive PPD 2010   per pt skin test, and had negative cxr   History of pregnancy induced hypertension  Infertility, 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 IVF pregnancy through Dr Ralston Burkes Day 5 embryo transfer. PGS-A testing completed   Pelvic hematoma, female    S/P dilatation and curettage 03/01/2021   Type 2 diabetes mellitus (HCC)    followed by pcp  (06-10-2021  pt stated does not check blood sugar at home)   Wears contact lenses     Medications:  Current Outpatient Medications on File Prior to Visit  Medication Sig Dispense Refill   Cetirizine HCl (ZYRTEC ALLERGY) 10 MG CAPS Take 10 mg by mouth daily as needed.     Multiple Vitamin (MULTIVITAMIN ADULT PO) Take by mouth daily.     tirzepatide  (MOUNJARO ) 7.5 MG/0.5ML Pen Inject 7.5 mg into the skin once a week. 2 mL 2   No current facility-administered medications on file prior to visit.   Wt Readings from Last 3 Encounters:  10/04/23 239 lb (108.4 kg)  09/28/23 233 lb 12.8 oz (106.1 kg)  06/26/23 240 lb (108.9 kg)   Ht Readings from Last 3 Encounters:  10/04/23 5\' 10"  (1.778 m)  09/28/23 5\' 10"  (1.778 m)  06/26/23 5\' 10"  (1.778 m)   Body mass index is 34.29 kg/m. @BMIFA @ Facility age limit for growth %iles is 20 years. Facility age limit for growth %iles is 20 years.   Notable Signs/Symptoms: none  Lifestyle & Dietary Hx Married and lives with child Eats 60% of meal at home  Estimated daily fluid intake: 60 oz Supplements: MIV Sleep: 8 Stress / self-care:  Current average weekly physical activity: 3 times a  week   24-Hr Dietary Recall First Meal: Black coffee, protein shake Snack: 1 hard boiled egg Second Meal: grilled chicken, vegetables with salad or starch Snack: protein bar Third Meal: Asparagus, pork chop, water Snack:  Beverages: water, coke zero a few times per week.  Estimated Energy Needs Calories: 1500 Carbohydrate: 170g Protein: 112g Fat: 42g   NUTRITION DIAGNOSIS  NI-1.7 Predicted excessive energy intake As related to Excessive calorie intake.  As evidenced by BM 34 and hi/o Type 2 Dm.I .   NUTRITION INTERVENTION  Nutrition education (E-1) on the following topics:  Nutrition and Diabetes education provided on My Plate, CHO counting, meal planning, portion sizes, timing of meals, avoiding snacks between meals unless having a low blood sugar, target ranges for A1C and blood sugars, signs/symptoms and treatment of hyper/hypoglycemia, monitoring blood sugars, taking medications as prescribed, benefits of exercising 30 minutes per day and prevention of complications of DM.  Lifestyle Medicine  - Whole Food, Plant Predominant Nutrition is highly recommended: Eat Plenty of vegetables, Mushrooms, fruits, Legumes, Whole Grains, Nuts, seeds in lieu of processed meats, processed snacks/pastries red meat, poultry, eggs.    -It is better to avoid simple carbohydrates including: Cakes, Sweet Desserts, Ice Cream, Soda (diet and regular), Sweet Tea, Candies, Chips, Cookies, Store Bought Juices, Alcohol in Excess of  1-2 drinks a day, Lemonade,  Artificial Sweeteners, Doughnuts, Coffee Creamers, "Sugar-free" Products, etc, etc.  This is not a complete list.....  Exercise: If you are able: 30 -60 minutes a day ,4 days a week, or 150 minutes a week.  The longer the better.  Combine stretch, strength, and aerobic activities.  If you were told in the past that you have high risk for cardiovascular diseases, you may seek evaluation by your heart doctor prior to initiating moderate to intense  exercise programs.   Handouts Provided Include  Lifestyle Medicine handouts Meal  planning information  Learning Style & Readiness for Change Teaching method utilized: Visual & Auditory  Demonstrated degree of understanding via: Teach Back  Barriers to learning/adherence to lifestyle change: none  Goals Established by Pt  Eat three meals per day  Eat 45 grams of carbs per meal Don't skip meals Focus on high fiber foods Increase whole plant based foods. Lose 2 lbs per month    MONITORING & EVALUATION Dietary intake, weekly physical activity, and  in 1 month.  Next Steps  Patient is to work on whole plant based foods.Aaron Aas

## 2023-10-17 ENCOUNTER — Encounter: Payer: Self-pay | Admitting: Nutrition

## 2023-10-17 NOTE — Patient Instructions (Signed)
 Goals Established by Pt  Eat three meals per day  Eat 45 grams of carbs per meal Don't skip meals Focus on high fiber foods Increase whole plant based foods. Lose 2 lbs per month

## 2023-10-23 ENCOUNTER — Other Ambulatory Visit: Payer: Self-pay

## 2023-11-07 ENCOUNTER — Ambulatory Visit: Admitting: Nutrition

## 2023-11-14 ENCOUNTER — Other Ambulatory Visit (HOSPITAL_COMMUNITY): Payer: Self-pay

## 2023-11-15 ENCOUNTER — Other Ambulatory Visit (HOSPITAL_COMMUNITY): Payer: Self-pay

## 2023-11-15 ENCOUNTER — Other Ambulatory Visit: Payer: Self-pay

## 2023-11-16 ENCOUNTER — Other Ambulatory Visit: Payer: Self-pay

## 2023-11-30 ENCOUNTER — Ambulatory Visit: Admitting: Nutrition

## 2023-12-13 ENCOUNTER — Encounter: Attending: Family Medicine | Admitting: Nutrition

## 2023-12-13 ENCOUNTER — Encounter: Payer: Self-pay | Admitting: Nutrition

## 2023-12-13 VITALS — Ht 70.0 in | Wt 237.0 lb

## 2023-12-13 DIAGNOSIS — E1169 Type 2 diabetes mellitus with other specified complication: Secondary | ICD-10-CM | POA: Insufficient documentation

## 2023-12-13 DIAGNOSIS — E669 Obesity, unspecified: Secondary | ICD-10-CM | POA: Insufficient documentation

## 2023-12-13 DIAGNOSIS — E6609 Other obesity due to excess calories: Secondary | ICD-10-CM | POA: Insufficient documentation

## 2023-12-13 NOTE — Progress Notes (Unsigned)
 Medical Nutrition Therapy  Appointment Start time:  [id number]  Appointment End time:  1700 Primary concerns today: Uncontrolled Type 2 DM  Referral diagnosis: E11.8 Preferred learning style: No preference  Learning readiness: Ready   NUTRITION ASSESSMENT  Changes made:  Incorportaing more beans, eating quiona now. Currenntly on Mounjero. Lost 2 lbs. Is frustrated she isn't losing more weight. Encouraged more exercise and getting in 250 minutes a week for desired weight loss. A1C 5.7%, down from 5.8 and 6.3%. Insulin  sensitivity improving. Eating better quality of foods. Goals set previously  Eat three meals per day -doing much better Eat 45 grams of carbs per meal-done Don't skip meals- done Focus on high fiber foods done Increase whole plant based foods.-done Lose 2 lbs per month    Latest Ref Rng & Units 03/22/2023    2:29 PM 02/05/2023    8:44 AM 04/04/2022   11:10 AM  CMP  Glucose 70 - 99 mg/dL 837  863  888   BUN 6 - 23 mg/dL 15  14  12    Creatinine 0.40 - 1.20 mg/dL 9.30  9.36  9.44   Sodium 135 - 145 mEq/L 140  135  138   Potassium 3.5 - 5.1 mEq/L 4.1  4.3  4.5   Chloride 96 - 112 mEq/L 103  105  104   CO2 19 - 32 mEq/L 29  20  25    Calcium 8.4 - 10.5 mg/dL 9.5  9.1  9.2   Total Protein 6.0 - 8.3 g/dL 7.0  7.3  8.0   Total Bilirubin 0.2 - 1.2 mg/dL 0.3  0.4  0.5   Alkaline Phos 39 - 117 U/L 47  45  42   AST 0 - 37 U/L 17  13  26    ALT 0 - 35 U/L 21  17  44      Clinical    Latest Ref Rng & Units 03/22/2023    2:29 PM 02/05/2023    8:44 AM 04/04/2022   11:10 AM  CMP  Glucose 70 - 99 mg/dL 837  863  888   BUN 6 - 23 mg/dL 15  14  12    Creatinine 0.40 - 1.20 mg/dL 9.30  9.36  9.44   Sodium 135 - 145 mEq/L 140  135  138   Potassium 3.5 - 5.1 mEq/L 4.1  4.3  4.5   Chloride 96 - 112 mEq/L 103  105  104   CO2 19 - 32 mEq/L 29  20  25    Calcium 8.4 - 10.5 mg/dL 9.5  9.1  9.2   Total Protein 6.0 - 8.3 g/dL 7.0  7.3  8.0   Total Bilirubin 0.2 - 1.2 mg/dL 0.3  0.4  0.5    Alkaline Phos 39 - 117 U/L 47  45  42   AST 0 - 37 U/L 17  13  26    ALT 0 - 35 U/L 21  17  44    Lipid Panel     Component Value Date/Time   CHOL 149 03/22/2023 1429   TRIG 138.0 03/22/2023 1429   HDL 48.00 03/22/2023 1429   CHOLHDL 3 03/22/2023 1429   VLDL 27.6 03/22/2023 1429   LDLCALC 73 03/22/2023 1429   Lab Results  Component Value Date   HGBA1C 5.7 (A) 09/28/2023   Wt Readings from Last 3 Encounters:  12/13/23 237 lb (107.5 kg)  10/04/23 239 lb (108.4 kg)  09/28/23 233 lb 12.8 oz (106.1 kg)   Ht  Readings from Last 3 Encounters:  12/13/23 5' 10 (1.778 m)  10/04/23 5' 10 (1.778 m)  09/28/23 5' 10 (1.778 m)   Body mass index is 34.01 kg/m. @BMIFA @ Facility age limit for growth %iles is 20 years. Facility age limit for growth %iles is 20 years.  Medical Hx:  Past Medical History:  Diagnosis Date   Chronic hypertension affecting pregnancy    History of bilateral breast reduction surgery 01/06/2012   History of ectopic pregnancy 01/2021   treated with medication   History of positive PPD 2010   per pt skin test, and had negative cxr   History of pregnancy induced hypertension    Infertility, 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 IVF pregnancy through Dr Drury Day 5 embryo transfer. PGS-A testing completed   Pelvic hematoma, female    S/P dilatation and curettage 03/01/2021   Type 2 diabetes mellitus (HCC)    followed by pcp  (06-10-2021  pt stated does not check blood sugar at home)   Wears contact lenses     Medications:  Current Outpatient Medications on File Prior to Visit  Medication Sig Dispense Refill   Cetirizine HCl (ZYRTEC ALLERGY) 10 MG CAPS Take 10 mg by mouth daily as needed.     Multiple Vitamin (MULTIVITAMIN ADULT PO) Take by mouth daily.     tirzepatide  (MOUNJARO ) 7.5 MG/0.5ML Pen Inject 7.5 mg into the skin once a week. 2 mL 2   No current facility-administered medications on file prior to  visit.   Wt Readings from Last 3 Encounters:  10/04/23 239 lb (108.4 kg)  09/28/23 233 lb 12.8 oz (106.1 kg)  06/26/23 240 lb (108.9 kg)   Ht Readings from Last 3 Encounters:  10/04/23 5' 10 (1.778 m)  09/28/23 5' 10 (1.778 m)  06/26/23 5' 10 (1.778 m)   There is no height or weight on file to calculate BMI. @BMIFA @ Facility age limit for growth %iles is 20 years. Facility age limit for growth %iles is 20 years.   Notable Signs/Symptoms: none  Lifestyle & Dietary Hx Married and lives with child Eats 60% of meal at home  Estimated daily fluid intake: 60 oz Supplements: MIV Sleep: 8 Stress / self-care:  Current average weekly physical activity: 3 times a week   24-Hr Dietary Recall First Meal: Black coffee, protein shake Snack: 1 hard boiled egg Second Meal: grilled chicken, vegetables with salad or starch Snack: protein bar Third Meal: Asparagus, pork chop, water Snack:  Beverages: water, coke zero a few times per week.  Estimated Energy Needs Calories: 1500 Carbohydrate: 170g Protein: 112g Fat: 42g   NUTRITION DIAGNOSIS  NI-1.7 Predicted excessive energy intake As related to Excessive calorie intake.  As evidenced by BM 34 and hi/o Type 2 Dm.I .   NUTRITION INTERVENTION  Nutrition education (E-1) on the following topics:  Nutrition and Diabetes education provided on My Plate, CHO counting, meal planning, portion sizes, timing of meals, avoiding snacks between meals unless having a low blood sugar, target ranges for A1C and blood sugars, signs/symptoms and treatment of hyper/hypoglycemia, monitoring blood sugars, taking medications as prescribed, benefits of exercising 30 minutes per day and prevention of complications of DM.  Lifestyle Medicine  - Whole Food, Plant Predominant Nutrition is highly recommended: Eat Plenty of vegetables, Mushrooms, fruits, Legumes, Whole Grains, Nuts, seeds in lieu of processed meats, processed snacks/pastries red meat,  poultry, eggs.    -It is better to avoid simple  carbohydrates including: Cakes, Sweet Desserts, Ice Cream, Soda (diet and regular), Sweet Tea, Candies, Chips, Cookies, Store Bought Juices, Alcohol in Excess of  1-2 drinks a day, Lemonade,  Artificial Sweeteners, Doughnuts, Coffee Creamers, Sugar-free Products, etc, etc.  This is not a complete list.....  Exercise: If you are able: 30 -60 minutes a day ,4 days a week, or 150 minutes a week.  The longer the better.  Combine stretch, strength, and aerobic activities.  If you were told in the past that you have high risk for cardiovascular diseases, you may seek evaluation by your heart doctor prior to initiating moderate to intense exercise programs.   Handouts Provided Include  Lifestyle Medicine handouts Meal planning information  Learning Style & Readiness for Change Teaching method utilized: Visual & Auditory  Demonstrated degree of understanding via: Teach Back  Barriers to learning/adherence to lifestyle change: none  Goals Established by Pt   Aim to get in 25 grams of fiber per day Work out 3 times a week 30 minutes each time.  MONITORING & EVALUATION Dietary intake, weekly physical activity, and  in 1 month.  Next Steps  Patient is to work on whole plant based foods.Melinda Castillo

## 2023-12-13 NOTE — Patient Instructions (Signed)
 Goals  Aim to get in 25 grams of fiber per day Work out 3 times a week 30 minutes each time.

## 2023-12-14 ENCOUNTER — Other Ambulatory Visit (HOSPITAL_COMMUNITY): Payer: Self-pay

## 2023-12-20 ENCOUNTER — Encounter: Payer: Self-pay | Admitting: Nutrition

## 2024-01-10 ENCOUNTER — Other Ambulatory Visit: Payer: Self-pay | Admitting: Family Medicine

## 2024-01-12 ENCOUNTER — Other Ambulatory Visit (HOSPITAL_COMMUNITY): Payer: Self-pay

## 2024-01-12 ENCOUNTER — Other Ambulatory Visit: Payer: Self-pay

## 2024-01-12 MED ORDER — MOUNJARO 7.5 MG/0.5ML ~~LOC~~ SOAJ
7.5000 mg | SUBCUTANEOUS | 2 refills | Status: DC
  Filled 2024-01-12: qty 2, 28d supply, fill #0
  Filled 2024-02-10: qty 2, 28d supply, fill #1
  Filled 2024-03-07: qty 2, 28d supply, fill #2

## 2024-02-10 ENCOUNTER — Other Ambulatory Visit (HOSPITAL_COMMUNITY): Payer: Self-pay

## 2024-03-07 ENCOUNTER — Other Ambulatory Visit (HOSPITAL_COMMUNITY): Payer: Self-pay

## 2024-03-12 ENCOUNTER — Ambulatory Visit: Admitting: Nutrition

## 2024-03-14 ENCOUNTER — Encounter: Admitting: Family Medicine

## 2024-03-14 DIAGNOSIS — Z130 Encounter for screening for diseases of the blood and blood-forming organs and certain disorders involving the immune mechanism: Secondary | ICD-10-CM | POA: Diagnosis not present

## 2024-03-14 DIAGNOSIS — Z01419 Encounter for gynecological examination (general) (routine) without abnormal findings: Secondary | ICD-10-CM | POA: Diagnosis not present

## 2024-03-14 DIAGNOSIS — Z1389 Encounter for screening for other disorder: Secondary | ICD-10-CM | POA: Diagnosis not present

## 2024-03-25 ENCOUNTER — Encounter: Admitting: Family Medicine

## 2024-03-28 ENCOUNTER — Telehealth: Payer: Self-pay | Admitting: Pharmacy Technician

## 2024-03-28 NOTE — Telephone Encounter (Signed)
 Pharmacy Patient Advocate Encounter   Received notification from CoverMyMeds that prior authorization for Mounjaro  2.5MG /0.5ML auto-injectors is due for renewal.   Insurance verification completed.   The patient is insured through [facility]  Action: Medication has been discontinued. Archived Key: [id number]  **She's on a higher dosage now.**

## 2024-03-29 ENCOUNTER — Other Ambulatory Visit (HOSPITAL_COMMUNITY): Payer: Self-pay

## 2024-03-29 ENCOUNTER — Other Ambulatory Visit: Payer: Self-pay

## 2024-03-29 ENCOUNTER — Ambulatory Visit: Admitting: Family Medicine

## 2024-03-29 ENCOUNTER — Encounter: Payer: Self-pay | Admitting: Family Medicine

## 2024-03-29 VITALS — BP 140/100 | HR 84 | Temp 97.7°F | Ht 70.0 in | Wt 233.0 lb

## 2024-03-29 DIAGNOSIS — R7989 Other specified abnormal findings of blood chemistry: Secondary | ICD-10-CM | POA: Diagnosis not present

## 2024-03-29 DIAGNOSIS — K219 Gastro-esophageal reflux disease without esophagitis: Secondary | ICD-10-CM

## 2024-03-29 DIAGNOSIS — Z6833 Body mass index (BMI) 33.0-33.9, adult: Secondary | ICD-10-CM

## 2024-03-29 DIAGNOSIS — Z0001 Encounter for general adult medical examination with abnormal findings: Secondary | ICD-10-CM

## 2024-03-29 DIAGNOSIS — I152 Hypertension secondary to endocrine disorders: Secondary | ICD-10-CM

## 2024-03-29 DIAGNOSIS — E669 Obesity, unspecified: Secondary | ICD-10-CM

## 2024-03-29 DIAGNOSIS — E559 Vitamin D deficiency, unspecified: Secondary | ICD-10-CM | POA: Diagnosis not present

## 2024-03-29 DIAGNOSIS — E119 Type 2 diabetes mellitus without complications: Secondary | ICD-10-CM | POA: Diagnosis not present

## 2024-03-29 DIAGNOSIS — E1159 Type 2 diabetes mellitus with other circulatory complications: Secondary | ICD-10-CM | POA: Diagnosis not present

## 2024-03-29 LAB — CBC WITH DIFFERENTIAL/PLATELET
Basophils Absolute: 0 K/uL (ref 0.0–0.1)
Basophils Relative: 0.2 % (ref 0.0–3.0)
Eosinophils Absolute: 0.1 K/uL (ref 0.0–0.7)
Eosinophils Relative: 2.1 % (ref 0.0–5.0)
HCT: 41.7 % (ref 36.0–46.0)
Hemoglobin: 13.9 g/dL (ref 12.0–15.0)
Lymphocytes Relative: 37.7 % (ref 12.0–46.0)
Lymphs Abs: 1.4 K/uL (ref 0.7–4.0)
MCHC: 33.4 g/dL (ref 30.0–36.0)
MCV: 87.9 fl (ref 78.0–100.0)
Monocytes Absolute: 0.2 K/uL (ref 0.1–1.0)
Monocytes Relative: 6.6 % (ref 3.0–12.0)
Neutro Abs: 1.9 K/uL (ref 1.4–7.7)
Neutrophils Relative %: 53.4 % (ref 43.0–77.0)
Platelets: 165 K/uL (ref 150.0–400.0)
RBC: 4.74 Mil/uL (ref 3.87–5.11)
RDW: 13.4 % (ref 11.5–15.5)
WBC: 3.6 K/uL — ABNORMAL LOW (ref 4.0–10.5)

## 2024-03-29 LAB — COMPREHENSIVE METABOLIC PANEL WITH GFR
ALT: 18 U/L (ref 0–35)
AST: 16 U/L (ref 0–37)
Albumin: 4.7 g/dL (ref 3.5–5.2)
Alkaline Phosphatase: 39 U/L (ref 39–117)
BUN: 10 mg/dL (ref 6–23)
CO2: 27 meq/L (ref 19–32)
Calcium: 9.4 mg/dL (ref 8.4–10.5)
Chloride: 104 meq/L (ref 96–112)
Creatinine, Ser: 0.6 mg/dL (ref 0.40–1.20)
GFR: 112.68 mL/min (ref >60.00)
Glucose, Bld: 112 mg/dL — ABNORMAL HIGH (ref 70–99)
Potassium: 4.4 meq/L (ref 3.5–5.1)
Sodium: 139 meq/L (ref 135–145)
Total Bilirubin: 0.5 mg/dL (ref 0.2–1.2)
Total Protein: 7.1 g/dL (ref 6.0–8.3)

## 2024-03-29 LAB — TSH: TSH: 0.87 u[IU]/mL (ref 0.35–5.50)

## 2024-03-29 LAB — LIPID PANEL
Cholesterol: 154 mg/dL (ref 0–200)
HDL: 44.4 mg/dL (ref >39.00)
LDL Cholesterol: 96 mg/dL (ref 0–99)
NonHDL: 109.47
Total CHOL/HDL Ratio: 3
Triglycerides: 67 mg/dL (ref 0.0–149.0)
VLDL: 13.4 mg/dL (ref 0.0–40.0)

## 2024-03-29 LAB — MICROALBUMIN / CREATININE URINE RATIO
Creatinine,U: 38.8 mg/dL
Microalb Creat Ratio: UNDETERMINED mg/g (ref 0.0–30.0)
Microalb, Ur: <0.7 mg/dL

## 2024-03-29 LAB — HEMOGLOBIN A1C: Hgb A1c MFr Bld: 6.3 % (ref 4.6–6.5)

## 2024-03-29 MED ORDER — VALSARTAN 80 MG PO TABS
80.0000 mg | ORAL_TABLET | Freq: Every day | ORAL | 3 refills | Status: AC
  Filled 2024-03-29: qty 90, 90d supply, fill #0
  Filled 2024-06-21: qty 90, 90d supply, fill #1
  Filled 2024-07-26: qty 90, 90d supply, fill #2

## 2024-03-29 MED ORDER — TIRZEPATIDE 5 MG/0.5ML ~~LOC~~ SOAJ
5.0000 mg | SUBCUTANEOUS | 3 refills | Status: AC
  Filled 2024-03-29: qty 2, 28d supply, fill #0
  Filled 2024-05-07: qty 2, 28d supply, fill #1
  Filled 2024-06-03: qty 2, 28d supply, fill #2
  Filled 2024-06-28: qty 2, 28d supply, fill #3
  Filled 2024-07-26: qty 2, 28d supply, fill #4

## 2024-03-29 NOTE — Progress Notes (Signed)
 Subjective  Chief Complaint  Patient presents with   Annual Exam    Pt here for Annual Exam and is currently fasting    Diabetes    HPI: Melinda Castillo is a 38 y.o. female who presents to [facility] [place] today for a Female Wellness Visit. She also has the concerns and/or needs as listed above in the chief complaint. These will be addressed in addition to the Health Maintenance Visit.   Wellness Visit: annual visit with health maintenance review and exam  HM: Turning 38 next month.  Will be eligible for her first mammogram.  Patient to schedule an.  Pap smears are current.  Increase stressors over the last several months.  Increased work stress and home stress.  72-year-old son had chronic otitis media and tonsils and adenoids removed.  Fortunately he is now doing better.  Patient's diet has been fair and she has not been exercising due to how busy she has been.  She is now motivated to improve these things for herself. Chronic disease f/u and/or acute problem visit: (deemed necessary to be done in addition to the wellness visit): Discussed the use of AI scribe software for clinical note transcription with the patient, who gave verbal consent to proceed.  History of Present Illness Melinda Castillo is a 38 year old female with diabetes who presents with concerns about medication side effects and weight management.  Gastrointestinal symptoms - Persistent heartburn associated with current Mounjaro  7.5 mg dose - No heartburn or nausea when on lower 5 mg dose of Mounjaro  - Occasionally uses Prilosec for symptom relief but prefers to avoid additional medications  Weight management - Weight loss has plateaued recently - Attributes stagnation to lack of exercise over the past two months - Recently resumed exercise regimen - Focusing on dietary improvements, including discontinuing sodas since June and tracking food intake  Hypertension and blood pressure  monitoring - Home blood pressure readings typically in the 130s/90s range, higher than desired - Family history of hypertension in both parents - Work-related stress may be contributing to elevated blood pressure - No active stress management currently, but considering exercise and meditation   Assessment  1. Encounter for well adult exam with abnormal findings   2. Type 2 diabetes mellitus in patient with obesity (HCC)   3. Severe obesity (BMI 35.0-39.9) with comorbidity (HCC)   4. Low serum low density lipoprotein (LDL)   5. Vitamin D  deficiency   6. Hypertension associated with diabetes (HCC)   7. Gastroesophageal reflux disease, unspecified whether esophagitis present      Plan  Female Wellness Visit: Age appropriate Health Maintenance and Prevention measures were discussed with patient. Included topics are cancer screening recommendations, ways to keep healthy (see AVS) including dietary and exercise recommendations, regular eye and dental care, use of seat belts, and avoidance of moderate alcohol use and tobacco use.  BMI: discussed patient's BMI and encouraged positive lifestyle modifications to help get to or maintain a target BMI. HM needs and immunizations were addressed and ordered. See below for orders. See HM and immunization section for updates. Routine labs and screening tests ordered including cmp, cbc and lipids where appropriate. Discussed recommendations regarding Vit D and calcium supplementation (see AVS)  Chronic disease management visit and/or acute problem visit: Assessment and Plan Assessment & Plan Type 2 diabetes mellitus Diabetes well controlled.  Current Mounjaro  7.5 mg dose causes heartburn and nausea. Better tolerance at 5 mg. - Reduce Mounjaro  dose to  5 mg. - Check A1c today. - Monitor diabetes control with reduced Mounjaro  dose. - Urine nephropathy screen today. - Diabetic eye exam up-to-date - Recommend improving diet.  She has seen a nutritionist.   Increase exercise.  Gastroesophageal reflux symptoms Heartburn linked to Mounjaro  dose. Prefers non-pharmacological management. - Reduce Mounjaro  dose to 5 mg. - Consider 3-6 week course of Prilosec if symptoms persist.  Essential hypertension Home BP in 130s/90s.  Blood pressure in office 140/100 today.  Target <130/80 mmHg due to diabetes. Valsartan chosen for renal protection and tolerance. - Start valsartan 80 mg once daily. - Monitor blood pressure at home. - Recheck blood pressure in 2-3 weeks. - Reevaluate in 6 months. - Check renal function and electrolytes today.  Check lipid panel.  Has low LDL actually.  Defers statins.  Obesity Weight loss slow. Plans to increase physical activity. Improved diet with reduced soda and food tracking. Lean protein intake emphasized. - Increase physical activity. - Focus on lean protein intake. - Continue dietary improvements. - Continue Mounjaro  5 mg weekly  Discussed stress management.   Follow up: 6 months to recheck diabetes and blood pressure Orders Placed This Encounter  Procedures   CBC with Differential/Platelet   Comprehensive metabolic panel with GFR   Lipid panel   Hemoglobin A1c   TSH   Microalbumin / creatinine urine ratio   Meds ordered this encounter  Medications   valsartan (DIOVAN) 80 MG tablet    Sig: Take 1 tablet (80 mg total) by mouth daily.    Dispense:  90 tablet    Refill:  3   tirzepatide  (MOUNJARO ) 5 MG/0.5ML Pen    Sig: Inject 5 mg into the skin once a week.    Dispense:  6 mL    Refill:  3      Body mass index is 33.43 kg/m. Wt Readings from Last 3 Encounters:  03/29/24 233 lb (105.7 kg)  12/13/23 237 lb (107.5 kg)  10/04/23 239 lb (108.4 kg)     Patient Active Problem List   Diagnosis Date Noted   Low serum low density lipoprotein (LDL) 09/28/2023    Priority: High   Severe obesity (BMI 35.0-39.9) with comorbidity (HCC) 06/26/2023    Priority: High   Type 2 diabetes mellitus in  patient with obesity (HCC) 11/02/2016    Priority: High    Tolerates low dose metformin  only. Failed ozempic due to urgency and diarrhea; trial of mounjaro  03/2023, stopped metformin  xr 1500 daily to push mounjaro  dose for diabetic control and weight loss 05/2023 Not on statin: LDL 73.    Hypertension associated with diabetes (HCC) 11/02/2016    Priority: High   History of pregnancy induced hypertension 03/22/2023    Priority: Medium    Single live birth 09/25/2019    Priority: Medium     PLTCS on 09/25/2019 for failure to progress/failed IOL (SVE 5cm >12 hours). Patient was mid IOL for cHTN with SI PreE with SF and T2DM. NBM weighing 8lbs 13oz. APGARS 8/9. EBL 900cc and Hemabate x 1 given.    Infertility, female 02/28/2019    Priority: Medium     Formatting of this note might be different from the original. IVF pregnancy through Dr Drury Day 5 embryo transfer. PGS-A testing completed    Seasonal allergic rhinitis due to pollen 03/22/2023    Priority: Low   Vitamin D  deficiency 08/22/2011    Priority: Low   Health Maintenance  Topic Date Due   Hepatitis B Vaccines 19-59  Average Risk (2 of 3 - 19+ 3-dose series) 04/05/2011   Diabetic kidney evaluation - Urine ACR  07/22/2021   Diabetic kidney evaluation - eGFR measurement  03/21/2024   HEMOGLOBIN A1C  03/29/2024   Influenza Vaccine  09/24/2024 (Originally 01/26/2024)   Pneumococcal Vaccine (2 of 2 - PCV) 03/29/2025 (Originally 07/22/2021)   OPHTHALMOLOGY EXAM  06/07/2024   FOOT EXAM  03/29/2025   Cervical Cancer Screening (HPV/Pap Cotest)  03/13/2028   DTaP/Tdap/Td (2 - Td or Tdap) 08/01/2029   Hepatitis C Screening  Completed   HIV Screening  Completed   Meningococcal B Vaccine  Aged Out   HPV VACCINES  Discontinued   COVID-19 Vaccine  Discontinued   Immunization History  Administered Date(s) Administered   HPV Quadrivalent 01/29/2009   Hepatitis B 03/08/2011   Influenza Split 03/28/2013   Influenza, Quadrivalent,  Recombinant, Inj, Pf 03/28/2019   Influenza, Seasonal, Injecte, Preservative Fre 03/27/2018, 03/28/2019   Influenza-Unspecified 03/27/2020, 03/27/2021   MMR 05/14/1994   PFIZER(Purple Top)SARS-COV-2 Vaccination 06/29/2019, 07/20/2019, 03/26/2020   Pneumococcal Polysaccharide-23 07/22/2020   Rho (D) Immune Globulin 07/15/2019, 09/26/2019   Tdap 08/02/2019   We updated and reviewed the patient's past history in detail and it is documented below. Allergies: Patient has no known allergies. Past Medical History Patient  has a past medical history of Chronic hypertension affecting pregnancy, History of bilateral breast reduction surgery (01/06/2012), History of ectopic pregnancy (01/2021), History of positive PPD (2010), History of pregnancy induced hypertension, Infertility, female (02/28/2019), Pelvic hematoma, female, S/P dilatation and curettage (03/01/2021), Type 2 diabetes mellitus (HCC), and Wears contact lenses. Past Surgical History Patient  has a past surgical history that includes Breast reduction surgery (Bilateral, 2013); Tonsillectomy (2006); Cesarean section (09/25/2019); Colonoscopy with esophagogastroduodenoscopy (egd) (07/2018); IR US  Guide Vasc Access Right (03/02/2021); IR EMBO ART  VEN HEMORR LYMPH EXTRAV  INC GUIDE ROADMAPPING (03/02/2021); IR EMBO ART  VEN HEMORR LYMPH EXTRAV  INC GUIDE ROADMAPPING (03/02/2021); IR Angiogram Pelvis Selective Or Supraselective (03/03/2021); IR Angiogram Selective Each Additional Vessel (03/03/2021); IR Angiogram Selective Each Additional Vessel (03/03/2021); Dilation and curettage of uterus (N/A, 03/01/2021); Robotic assisted laparoscopic lysis of adhesion (N/A, 06/16/2021); Breast surgery (06/13); Dilatation and curettage/hysteroscopy with minerva (11/2021); and Cholecystectomy (N/A, 04/11/2022). Family History: Patient family history includes Brain cancer in her paternal grandfather; Early death in her maternal grandmother; Heart disease in her  maternal grandfather; Hypertension in her father and mother; Ovarian cancer in her maternal grandmother; Stroke in her paternal grandmother. Social History:  Patient  reports that she has never smoked. She has never used smokeless tobacco. She reports current alcohol use of about 1.0 standard drink of alcohol per week. She reports that she does not use drugs.  Review of Systems: Constitutional: negative for fever or malaise Ophthalmic: negative for photophobia, double vision or loss of vision Cardiovascular: negative for chest pain, dyspnea on exertion, or new LE swelling Respiratory: negative for SOB or persistent cough Gastrointestinal: negative for abdominal pain, change in bowel habits or melena Genitourinary: negative for dysuria or gross hematuria, no abnormal uterine bleeding or disharge Musculoskeletal: negative for new gait disturbance or muscular weakness Integumentary: negative for new or persistent rashes, no breast lumps Neurological: negative for TIA or stroke symptoms Psychiatric: negative for SI or delusions Allergic/Immunologic: negative for hives  Patient Care Team    Relationship Specialty Notifications Start End  Jodie Lavern CROME, MD PCP - General Family Medicine  03/22/23   Oneita Tresea DEL, OD  Optometry  07/22/20   Candi,  Lonni BIRCH, MD  Gastroenterology  07/22/20   Drury Ned, MD Referring Physician Reproductive Endocrinology and Infertility  03/22/23   Kristie Staggers, FNP  Obstetrics and Gynecology  03/22/23     Objective  Vitals: BP (!) 140/100   Pulse 84   Temp 97.7 F (36.5 C)   Ht 5' 10 (1.778 m)   Wt 233 lb (105.7 kg)   SpO2 98%   BMI 33.43 kg/m  General:  Well developed, well nourished, no acute distress  Psych:  Alert and orientedx3,normal mood and affect HEENT:  Normocephalic, atraumatic, non-icteric sclera,  supple neck without adenopathy, mass or thyromegaly Cardiovascular:  Normal S1, S2, RRR without gallop, rub or murmur Respiratory:   Good breath sounds bilaterally, CTAB with normal respiratory effort Gastrointestinal: normal bowel sounds, soft, non-tender, no noted masses. No HSM MSK: extremities without edema, joints without erythema or swelling Neurologic:    Mental status is normal.  Gross motor and sensory exams are normal.  No tremor  Commons side effects, risks, benefits, and alternatives for medications and treatment plan prescribed today were discussed, and the patient expressed understanding of the given instructions. Patient is instructed to call or message via MyChart if he/she has any questions or concerns regarding our treatment plan. No barriers to understanding were identified. We discussed Red Flag symptoms and signs in detail. Patient expressed understanding regarding what to do in case of urgent or emergency type symptoms.  Medication list was reconciled, printed and provided to the patient in AVS. Patient instructions and summary information was reviewed with the patient as documented in the AVS. This note was prepared with assistance of Dragon voice recognition software. Occasional wrong-word or sound-a-like substitutions may have occurred due to the inherent limitations of voice recognition software

## 2024-03-29 NOTE — Patient Instructions (Signed)
 Please return in 6 months to recheck diabetes and blood pressure   I will release your lab results to you on your MyChart account with further instructions. You may see the results before I do, but when I review them I will send you a message with my report or have my assistant call you if things need to be discussed. Please reply to my message with any questions. Thank you!   If you have any questions or concerns, please don't hesitate to send me a message via MyChart or call the office at [phone]. Thank you for visiting with us  today! It's our pleasure caring for you.    VISIT SUMMARY: During your visit, we discussed your concerns about medication side effects, weight management, and blood pressure. We reviewed your current medications and made some adjustments to better manage your symptoms and overall health.  YOUR PLAN: -TYPE 2 DIABETES MELLITUS: Your diabetes is well controlled, but the current dose of Mounjaro  is causing heartburn and nausea. We will reduce your Mounjaro  dose to 5 mg, which you tolerated better. We will also check your A1c today to monitor your diabetes control with the reduced dose.  -GASTROESOPHAGEAL REFLUX SYMPTOMS: Your heartburn is linked to the higher dose of Mounjaro . We will reduce the dose to 5 mg. If your symptoms persist, consider taking Prilosec for 3-6 weeks.  -ESSENTIAL HYPERTENSION: Your home blood pressure readings are higher than desired. We aim for a target of less than 130/80 mmHg due to your diabetes. We will start you on Valsartan 80 mg once daily to help protect your kidneys and manage your blood pressure. Please monitor your blood pressure at home and we will recheck it in 2-3 weeks. We will reevaluate your condition in 6 months.  -OBESITY: Your weight loss has slowed down, but you have made positive changes by resuming exercise and improving your diet. Continue to increase your physical activity and focus on lean protein intake. Keep up with your  dietary improvements, such as reducing soda consumption and tracking your food intake.  INSTRUCTIONS: Please follow up in 2-3 weeks to recheck your blood pressure and in 6 months for a general reevaluation. Continue to monitor your blood pressure at home and keep track of your diabetes control with the reduced Mounjaro  dose.                      Contains text generated by Abridge.                                 Contains text generated by Abridge.

## 2024-04-01 ENCOUNTER — Ambulatory Visit: Payer: Self-pay | Admitting: Family Medicine

## 2024-04-01 NOTE — Progress Notes (Signed)
 See mychart note Dear Ms. Melinda Castillo, Your lab results look good overall.  As you can see, your A1c is a little higher than last time but remains controlled. Your cholesterol levels are fair; most would recommend we consider adding a statin for heart and vascular protection given your diagnosis of diabetes. We can discuss this at some point.   Your liver and kidney and thyroid  function are normal.  Please take your new blood pressure pill and monitor your readings. I will see you again in 6 months to recheck things.   Sincerely, Dr. Jodie

## 2024-04-02 ENCOUNTER — Encounter: Attending: Family Medicine | Admitting: Nutrition

## 2024-04-02 VITALS — Ht 70.0 in | Wt 235.0 lb

## 2024-04-02 DIAGNOSIS — E669 Obesity, unspecified: Secondary | ICD-10-CM | POA: Insufficient documentation

## 2024-04-02 DIAGNOSIS — E119 Type 2 diabetes mellitus without complications: Secondary | ICD-10-CM | POA: Insufficient documentation

## 2024-04-02 DIAGNOSIS — E6609 Other obesity due to excess calories: Secondary | ICD-10-CM | POA: Insufficient documentation

## 2024-04-02 NOTE — Patient Instructions (Signed)
  Goals Established by Pt Keep up the great job of eating more whole plant based foods Increase physical activity as tolerated to 150 minutes per week minimum. 250 minutes per week is recommended for slow and steady weight loss If  you want to be weighed on bioimpedance scale in [place], let me know and we can arrange it.

## 2024-04-02 NOTE — Progress Notes (Signed)
 Medical Nutrition Therapy third visit [employer] Employee [phone] Appointment Start time:  [phone] Appointment End time:  1546 Primary concerns today: Uncontrolled Type 2 DM  Referral diagnosis: E11.8 Preferred learning style: No preference  Learning readiness: Ready   NUTRITION ASSESSMENT  Changes made:   Has been being more intentional with trying to get in 5 veggies per day. Had GI issues with refux and stomach burning with 7 mg of Moujero.  Will reduce amount  of Mounjaro  to 5 mg. Exercise: walking 30 minutes a day a few times per week.Has a lot of hills in her walk for better cardio and doing [employer] activities at home. Has some muscle soreness that affirms using different muscle groups.. Lipid profile WNL. No longer testing blood sugars. A1C up to 6.3% from 5.7%. Using My Fitness Pal for tracking food intake. Cooking more foods at home and meal planning.   Wt Readings from Last 3 Encounters:  04/02/24 235 lb (106.6 kg)  03/29/24 233 lb (105.7 kg)  12/13/23 237 lb (107.5 kg)   Ht Readings from Last 3 Encounters:  04/02/24 5' 10 (1.778 m)  03/29/24 5' 10 (1.778 m)  12/13/23 5' 10 (1.778 m)   Body mass index is 33.72 kg/m. @BMIFA @ Facility age limit for growth %iles is 20 years. Facility age limit for growth %iles is 20 years.     Latest Ref Rng & Units 03/29/2024    9:27 AM 03/22/2023    2:29 PM 02/05/2023    8:44 AM  CMP  Glucose 70 - 99 mg/dL 887  837  863   BUN 6 - 23 mg/dL 10  15  14    Creatinine 0.40 - 1.20 mg/dL 9.39  9.30  9.36   Sodium 135 - 145 mEq/L 139  140  135   Potassium 3.5 - 5.1 mEq/L 4.4  4.1  4.3   Chloride 96 - 112 mEq/L 104  103  105   CO2 19 - 32 mEq/L 27  29  20    Calcium 8.4 - 10.5 mg/dL 9.4  9.5  9.1   Total Protein 6.0 - 8.3 g/dL 7.1  7.0  7.3   Total Bilirubin 0.2 - 1.2 mg/dL 0.5  0.3  0.4   Alkaline Phos 39 - 117 U/L 39  47  45   AST 0 - 37 U/L 16  17  13    ALT 0 - 35 U/L 18  21  17       Lipid Panel     Component Value Date/Time    CHOL 154 03/29/2024 0927   TRIG 67.0 03/29/2024 0927   HDL 44.40 03/29/2024 0927   CHOLHDL 3 03/29/2024 0927   VLDL 13.4 03/29/2024 0927   LDLCALC 96 03/29/2024 0927   Lab Results  Component Value Date   HGBA1C 6.3 03/29/2024   Wt Readings from Last 3 Encounters:  03/29/24 233 lb (105.7 kg)  12/13/23 237 lb (107.5 kg)  10/04/23 239 lb (108.4 kg)   Ht Readings from Last 3 Encounters:  03/29/24 5' 10 (1.778 m)  12/13/23 5' 10 (1.778 m)  10/04/23 5' 10 (1.778 m)   There is no height or weight on file to calculate BMI. @BMIFA @ Facility age limit for growth %iles is 20 years. Facility age limit for growth %iles is 20 years.  Medical Hx:  Past Medical History:  Diagnosis Date   Chronic hypertension affecting pregnancy    History of bilateral breast reduction surgery 01/06/2012   History of ectopic pregnancy 01/2021  treated with medication   History of positive PPD 2010   per pt skin test, and had negative cxr   History of pregnancy induced hypertension    Infertility, 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 IVF pregnancy through Dr Drury Day 5 embryo transfer. PGS-A testing completed   Pelvic hematoma, female    S/P dilatation and curettage 03/01/2021   Type 2 diabetes mellitus (HCC)    followed by pcp  (06-10-2021  pt stated does not check blood sugar at home)   Wears contact lenses     Medications:  Current Outpatient Medications on File Prior to Visit  Medication Sig Dispense Refill   Cetirizine HCl (ZYRTEC ALLERGY) 10 MG CAPS Take 10 mg by mouth daily as needed.     Multiple Vitamin (MULTIVITAMIN ADULT PO) Take by mouth daily.     tirzepatide  (MOUNJARO ) 5 MG/0.5ML Pen Inject 5 mg into the skin once a week. 6 mL 3   valsartan (DIOVAN) 80 MG tablet Take 1 tablet (80 mg total) by mouth daily. 90 tablet 3   No current facility-administered medications on file prior to visit.   Wt Readings from Last 3 Encounters:   03/29/24 233 lb (105.7 kg)  12/13/23 237 lb (107.5 kg)  10/04/23 239 lb (108.4 kg)   Ht Readings from Last 3 Encounters:  03/29/24 5' 10 (1.778 m)  12/13/23 5' 10 (1.778 m)  10/04/23 5' 10 (1.778 m)   There is no height or weight on file to calculate BMI. @BMIFA @ Facility age limit for growth %iles is 20 years. Facility age limit for growth %iles is 20 years.   Notable Signs/Symptoms: none  Lifestyle & Dietary Hx Married and lives with child Eats 60% of meal at home  Estimated daily fluid intake: 60 oz Supplements: MIV Sleep: 8 Stress / self-care:  Current average weekly physical activity: 3 times a week   24-Hr Dietary Recall First Meal: Black coffee, protein shake Snack: 1 hard boiled egg Second Meal: grilled chicken, vegetables with salad or starch Snack: protein bar Third Meal: Asparagus, pork chop, water Snack:  Beverages: water, coke zero a few times per week.  Estimated Energy Needs Calories: 1500 Carbohydrate: 170g Protein: 112g Fat: 42g   NUTRITION DIAGNOSIS  NI-1.7 Predicted excessive energy intake As related to Excessive calorie intake.  As evidenced by BM 34 and hi/o Type 2 Dm.I .   NUTRITION INTERVENTION  Nutrition education (E-1) on the following topics:  Nutrition and Diabetes education provided on My Plate, CHO counting, meal planning, portion sizes, timing of meals, avoiding snacks between meals unless having a low blood sugar, target ranges for A1C and blood sugars, signs/symptoms and treatment of hyper/hypoglycemia, monitoring blood sugars, taking medications as prescribed, benefits of exercising 30 minutes per day and prevention of complications of DM.  Lifestyle Medicine  - Whole Food, Plant Predominant Nutrition is highly recommended: Eat Plenty of vegetables, Mushrooms, fruits, Legumes, Whole Grains, Nuts, seeds in lieu of processed meats, processed snacks/pastries red meat, poultry, eggs.    -It is better to avoid simple  carbohydrates including: Cakes, Sweet Desserts, Ice Cream, Soda (diet and regular), Sweet Tea, Candies, Chips, Cookies, Store Bought Juices, Alcohol in Excess of  1-2 drinks a day, Lemonade,  Artificial Sweeteners, Doughnuts, Coffee Creamers, Sugar-free Products, etc, etc.  This is not a complete list.....  Exercise: If you are able: 30 -60 minutes a day ,4 days a week, or 150 minutes a week.  The  longer the better.  Combine stretch, strength, and aerobic activities.  If you were told in the past that you have high risk for cardiovascular diseases, you may seek evaluation by your heart doctor prior to initiating moderate to intense exercise programs.   Handouts Provided Include  Lifestyle Medicine handouts Meal planning information  Learning Style & Readiness for Change Teaching method utilized: Visual & Auditory  Demonstrated degree of understanding via: Teach Back  Barriers to learning/adherence to lifestyle change: none  Goals Established by Pt Keep up the great job of eating more whole plant based foods Increase physical activity as tolerated to 150 minutes per week minimum. 250 minutes per week is recommended for slow and steady weight loss If  you want to be weighed on bioimpedance scale in [place], let me know and we can arrange it.   Aim to get in 25 grams of fiber per day-done Work out 3 times a week 30 minutes each time.-done  MONITORING & EVALUATION Dietary intake, weekly physical activity, PRN  Next Steps  Patient is to work on [employer] based foods.SABRA

## 2024-04-04 ENCOUNTER — Other Ambulatory Visit (HOSPITAL_COMMUNITY): Payer: Self-pay

## 2024-05-07 ENCOUNTER — Other Ambulatory Visit (HOSPITAL_COMMUNITY): Payer: Self-pay

## 2024-05-08 ENCOUNTER — Other Ambulatory Visit (HOSPITAL_COMMUNITY): Payer: Self-pay

## 2024-05-09 ENCOUNTER — Other Ambulatory Visit: Payer: Self-pay

## 2024-05-27 ENCOUNTER — Other Ambulatory Visit (HOSPITAL_COMMUNITY): Payer: Self-pay | Admitting: Family Medicine

## 2024-05-27 DIAGNOSIS — Z1231 Encounter for screening mammogram for malignant neoplasm of breast: Secondary | ICD-10-CM

## 2024-05-29 ENCOUNTER — Inpatient Hospital Stay (HOSPITAL_COMMUNITY): Admission: RE | Admit: 2024-05-29 | Discharge: 2024-05-29 | Attending: Family Medicine | Admitting: Family Medicine

## 2024-05-29 DIAGNOSIS — Z1231 Encounter for screening mammogram for malignant neoplasm of breast: Secondary | ICD-10-CM | POA: Insufficient documentation

## 2024-06-03 ENCOUNTER — Other Ambulatory Visit (HOSPITAL_COMMUNITY): Payer: Self-pay

## 2024-06-04 ENCOUNTER — Other Ambulatory Visit (HOSPITAL_COMMUNITY): Payer: Self-pay | Admitting: Family Medicine

## 2024-06-04 DIAGNOSIS — R928 Other abnormal and inconclusive findings on diagnostic imaging of breast: Secondary | ICD-10-CM

## 2024-06-10 DIAGNOSIS — E119 Type 2 diabetes mellitus without complications: Secondary | ICD-10-CM | POA: Diagnosis not present

## 2024-06-22 ENCOUNTER — Other Ambulatory Visit: Payer: Self-pay

## 2024-06-25 ENCOUNTER — Ambulatory Visit (HOSPITAL_COMMUNITY)
Admission: RE | Admit: 2024-06-25 | Discharge: 2024-06-25 | Disposition: A | Discharge status: Home / Self Care | Source: Ambulatory Visit | Attending: Family Medicine | Admitting: Family Medicine

## 2024-06-25 ENCOUNTER — Encounter (HOSPITAL_COMMUNITY): Payer: Self-pay

## 2024-06-25 DIAGNOSIS — R928 Other abnormal and inconclusive findings on diagnostic imaging of breast: Secondary | ICD-10-CM | POA: Insufficient documentation

## 2024-06-28 ENCOUNTER — Other Ambulatory Visit: Payer: Self-pay

## 2024-07-26 ENCOUNTER — Other Ambulatory Visit: Payer: Self-pay

## 2024-09-30 ENCOUNTER — Ambulatory Visit: Admitting: Family Medicine

## 2024-10-23 ENCOUNTER — Ambulatory Visit: Admitting: Family Medicine

## 2025-03-31 ENCOUNTER — Encounter: Admitting: Family Medicine
